# Patient Record
Sex: Female | Born: 1960 | Race: Black or African American | Hispanic: No | Smoking: Former smoker
Health system: Southern US, Community
[De-identification: ages and names within clinical notes are randomized; demographics above are authoritative.]

## PROBLEM LIST (undated history)

## (undated) DIAGNOSIS — K219 Gastro-esophageal reflux disease without esophagitis: Secondary | ICD-10-CM

## (undated) DIAGNOSIS — M7580 Other shoulder lesions, unspecified shoulder: Secondary | ICD-10-CM

## (undated) DIAGNOSIS — J45909 Unspecified asthma, uncomplicated: Secondary | ICD-10-CM

## (undated) DIAGNOSIS — J449 Chronic obstructive pulmonary disease, unspecified: Secondary | ICD-10-CM

## (undated) DIAGNOSIS — F419 Anxiety disorder, unspecified: Secondary | ICD-10-CM

## (undated) DIAGNOSIS — M778 Other enthesopathies, not elsewhere classified: Secondary | ICD-10-CM

## (undated) DIAGNOSIS — Z8673 Personal history of transient ischemic attack (TIA), and cerebral infarction without residual deficits: Secondary | ICD-10-CM

## (undated) HISTORY — DX: Anxiety disorder, unspecified: F41.9

## (undated) HISTORY — DX: Personal history of transient ischemic attack (TIA), and cerebral infarction without residual deficits: Z86.73

## (undated) HISTORY — PX: CHOLECYSTECTOMY: SHX55

## (undated) HISTORY — DX: Other shoulder lesions, unspecified shoulder: M75.80

## (undated) HISTORY — DX: Chronic obstructive pulmonary disease, unspecified: J44.9

## (undated) HISTORY — DX: Other enthesopathies, not elsewhere classified: M77.8

## (undated) HISTORY — DX: Gastro-esophageal reflux disease without esophagitis: K21.9

## (undated) HISTORY — PX: OTHER SURGICAL HISTORY: SHX169

## (undated) HISTORY — PX: TONSILLECTOMY: SUR1361

## (undated) HISTORY — DX: Unspecified asthma, uncomplicated: J45.909

---

## 2012-11-15 ENCOUNTER — Encounter: Payer: Self-pay | Admitting: Critical Care Medicine

## 2012-11-18 ENCOUNTER — Encounter: Payer: Self-pay | Admitting: Critical Care Medicine

## 2012-11-18 ENCOUNTER — Ambulatory Visit (INDEPENDENT_AMBULATORY_CARE_PROVIDER_SITE_OTHER): Payer: No Typology Code available for payment source | Admitting: Critical Care Medicine

## 2012-11-18 VITALS — BP 122/84 | HR 90 | Temp 98.1°F | Ht 64.0 in | Wt 250.0 lb

## 2012-11-18 DIAGNOSIS — Z8673 Personal history of transient ischemic attack (TIA), and cerebral infarction without residual deficits: Secondary | ICD-10-CM | POA: Insufficient documentation

## 2012-11-18 DIAGNOSIS — J449 Chronic obstructive pulmonary disease, unspecified: Secondary | ICD-10-CM

## 2012-11-18 DIAGNOSIS — F419 Anxiety disorder, unspecified: Secondary | ICD-10-CM | POA: Insufficient documentation

## 2012-11-18 DIAGNOSIS — J45909 Unspecified asthma, uncomplicated: Secondary | ICD-10-CM | POA: Insufficient documentation

## 2012-11-18 DIAGNOSIS — K219 Gastro-esophageal reflux disease without esophagitis: Secondary | ICD-10-CM

## 2012-11-18 MED ORDER — BUDESONIDE-FORMOTEROL FUMARATE 160-4.5 MCG/ACT IN AERO: 2.0000 | INHALATION_SPRAY | Freq: Two times a day (BID) | RESPIRATORY_TRACT | Status: DC

## 2012-11-18 MED ORDER — CHLORPHENIRAMINE TANNATE 12 MG PO TABS: 12.0000 mg | ORAL_TABLET | Freq: Every day | ORAL | Status: DC

## 2012-11-18 MED ORDER — BECLOMETHASONE DIPROPIONATE 80 MCG/ACT IN AERS: 3.0000 | INHALATION_SPRAY | Freq: Two times a day (BID) | RESPIRATORY_TRACT | Status: DC

## 2012-11-18 MED ORDER — OMEPRAZOLE 20 MG PO CPDR: 20.0000 mg | DELAYED_RELEASE_CAPSULE | Freq: Two times a day (BID) | ORAL | Status: DC

## 2012-11-18 MED ORDER — FAMOTIDINE 40 MG PO TABS: 40.0000 mg | ORAL_TABLET | Freq: Every evening | ORAL | Status: DC

## 2012-11-18 MED ORDER — BECLOMETHASONE DIPROPIONATE 80 MCG/ACT IN AERS: 2.0000 | INHALATION_SPRAY | Freq: Two times a day (BID) | RESPIRATORY_TRACT | Status: DC

## 2012-11-18 MED ORDER — AZITHROMYCIN 250 MG PO TABS: 250.0000 mg | ORAL_TABLET | Freq: Every day | ORAL | Status: DC

## 2012-11-18 NOTE — Progress Notes (Signed)
Subjective:    Patient ID: Sheryl Hicks, female    DOB: 1961/08/19, 52 y.o.   MRN: [id number]  HPI Comments: Hx of asthma and copd.  In hospital for 9 days over the Holidays   Shortness of Breath This is a chronic problem. The current episode started more than 1 year ago. The problem occurs constantly (rest and exertion). The problem has been rapidly worsening. Associated symptoms include chest pain, a fever, PND, a sore throat, sputum production, swollen glands and wheezing. Pertinent negatives include no abdominal pain, claudication, coryza, ear pain, headaches, hemoptysis, leg pain, leg swelling, neck pain, orthopnea, rash, rhinorrhea or vomiting. The symptoms are aggravated by any activity, exercise, eating, fumes, odors, smoke, emotional upset, weather changes and URIs (down to 2 cigs per day). Associated symptoms comments:  Mucus is white Notes GERD constant, regurgitates and chokes in sleep Stays hoarse. Risk factors include smoking. She has tried beta agonist inhalers and steroid inhalers for the symptoms. The treatment provided mild relief. Her past medical history is significant for asthma, COPD and DVT. There is no history of allergies, aspirin allergies, bronchiolitis, CAD, chronic lung disease, a heart failure, PE, pneumonia or a recent surgery. (Now off coumadin)    CAT Score 11/18/2012  Total CAT Score 39    Past Medical History  Diagnosis Date  . Anxiety   . COPD (chronic obstructive pulmonary disease)   . Tendonitis of shoulder   . GERD (gastroesophageal reflux disease)   . Asthma   . History of stroke      Family History  Problem Relation Age of Onset  . Lung cancer Sister   . Esophageal cancer Mother      History   Social History  . Marital Status: Divorced    Spouse Name: N/A    Number of Children: N/A  . Years of Education: N/A   Occupational History  . Not on file.   Social History Main Topics  . Smoking status: Current Every Day Smoker -- 0.20  packs/day    Types: Cigarettes  . Smokeless tobacco: Never Used     Comment: Started smoking at age 56.   Marland Kitchen Alcohol Use: Yes     Comment: ocass  . Drug Use: No     Comment: former use of crack/cocaine and marijuana  . Sexually Active: Not on file   Other Topics Concern  . Not on file   Social History Narrative  . No narrative on file     Allergies  Allergen Reactions  . Prednisone     hives     Outpatient Prescriptions Prior to Visit  Medication Sig Dispense Refill  . albuterol (PROVENTIL HFA;VENTOLIN HFA) 108 (90 BASE) MCG/ACT inhaler Inhale 2 puffs into the lungs every 6 (six) hours as needed.      . ALPRAZolam (XANAX) 1 MG tablet Take 1 mg by mouth 4 (four) times daily.       . ferrous sulfate 325 (65 FE) MG tablet Take 325 mg by mouth 3 (three) times daily.       Marland Kitchen oxyCODONE-acetaminophen (PERCOCET/ROXICET) 5-325 MG per tablet Take 1 tablet by mouth every 6 (six) hours.       Marland Kitchen omeprazole (PRILOSEC) 20 MG capsule Take 20 mg by mouth daily.       No facility-administered medications prior to visit.    Review of Systems  Constitutional: Positive for fever, chills, diaphoresis, activity change, fatigue and unexpected weight change. Negative for appetite change.  HENT: Positive for hearing  loss, congestion, sore throat, dental problem, voice change, sinus pressure and ear discharge. Negative for ear pain, nosebleeds, facial swelling, rhinorrhea, sneezing, mouth sores, trouble swallowing, neck pain, neck stiffness, postnasal drip and tinnitus.   Eyes: Positive for discharge and visual disturbance. Negative for photophobia and itching.  Respiratory: Positive for cough, sputum production, choking, chest tightness, shortness of breath and wheezing. Negative for apnea, hemoptysis and stridor.   Cardiovascular: Positive for chest pain, palpitations and PND. Negative for orthopnea, claudication and leg swelling.  Gastrointestinal: Positive for nausea, constipation and abdominal  distention. Negative for vomiting, abdominal pain and blood in stool.  Genitourinary: Negative for dysuria, urgency, frequency, hematuria, flank pain, decreased urine volume and difficulty urinating.  Musculoskeletal: Positive for back pain and gait problem. Negative for myalgias, joint swelling and arthralgias.  Skin: Positive for pallor. Negative for color change and rash.  Neurological: Positive for weakness and numbness. Negative for dizziness, tremors, seizures, syncope, speech difficulty, light-headedness and headaches.  Hematological: Negative for adenopathy. Does not bruise/bleed easily.  Psychiatric/Behavioral: Positive for sleep disturbance. Negative for confusion and agitation. The patient is nervous/anxious.        Objective:   Physical Exam Filed Vitals:   11/18/12 1037  BP: 122/84  Pulse: 90  Temp: 98.1 F (36.7 C)  TempSrc: Oral  Height: 5\' 4"  (1.626 m)  Weight: 113.399 kg (250 lb)  SpO2: 97%    Gen: Pleasant, well-nourished, in no distress,  normal affect  ENT: No lesions,  mouth clear,  oropharynx clear, no postnasal drip  Neck: No JVD, no TMG, no carotid bruits  Lungs: No use of accessory muscles, no dullness to percussion, exp wheezes, poor airflow, rhonchi  Cardiovascular: RRR, heart sounds normal, no murmur or gallops, no peripheral edema  Abdomen: soft and NT, no HSM,  BS normal  Musculoskeletal: No deformities, no cyanosis or clubbing  Neuro: alert, non focal  Skin: Warm, no lesions or rashes  CXR 10/2012: No active disease     Assessment & Plan:   COPD (chronic obstructive pulmonary disease) Copd recurrent exacerbations, GERD flare Plan Focus on smoking cessation Stop Advair Trial Symbicort two puff twice daily Trial Qvar 80 two puff twice daily Azithromycin 250mg  Take two once then one daily until gone Increase omeprazole to twice daily taken before meal Spirometry Follow  STRICT reflux  Return 2 months    Updated Medication  List Outpatient Encounter Prescriptions as of 11/18/2012  Medication Sig Dispense Refill  . albuterol (PROVENTIL HFA;VENTOLIN HFA) 108 (90 BASE) MCG/ACT inhaler Inhale 2 puffs into the lungs every 6 (six) hours as needed.      Marland Kitchen albuterol (PROVENTIL) (2.5 MG/3ML) 0.083% nebulizer solution Take 2.5 mg by nebulization every 4 (four) hours as needed for wheezing.      Marland Kitchen ALPRAZolam (XANAX) 1 MG tablet Take 1 mg by mouth 4 (four) times daily.       Marland Kitchen azithromycin (ZITHROMAX) 250 MG tablet Take 1 tablet (250 mg total) by mouth daily. Take two once then one daily until gone  6 each  0  . beclomethasone (QVAR) 80 MCG/ACT inhaler Inhale 3 puffs into the lungs 2 (two) times daily.  4.2 g  1  . budesonide-formoterol (SYMBICORT) 160-4.5 MCG/ACT inhaler Inhale 2 puffs into the lungs 2 (two) times daily.  6 g  1  . ferrous sulfate 325 (65 FE) MG tablet Take 325 mg by mouth 3 (three) times daily.       Marland Kitchen omeprazole (PRILOSEC) 20 MG capsule Take 1  capsule (20 mg total) by mouth 2 (two) times daily before a meal.  60 capsule  6  . oxyCODONE-acetaminophen (PERCOCET/ROXICET) 5-325 MG per tablet Take 1 tablet by mouth every 6 (six) hours.       . [DISCONTINUED] beclomethasone (QVAR) 80 MCG/ACT inhaler Inhale 2 puffs into the lungs 2 (two) times daily.  4.2 g  1  . [DISCONTINUED] Chlorpheniramine Tannate 12 MG TABS Take 1 tablet (12 mg total) by mouth at bedtime.  30 each  6  . [DISCONTINUED] famotidine (PEPCID) 40 MG tablet Take 1 tablet (40 mg total) by mouth every evening.  30 tablet  6  . [DISCONTINUED] Fluticasone-Salmeterol (ADVAIR) 250-50 MCG/DOSE AEPB Inhale 1 puff into the lungs every 12 (twelve) hours.      . [DISCONTINUED] omeprazole (PRILOSEC) 20 MG capsule Take 20 mg by mouth daily.       No facility-administered encounter medications on file as of 11/18/2012.

## 2012-11-18 NOTE — Assessment & Plan Note (Addendum)
Copd recurrent exacerbations, GERD flare Plan Focus on smoking cessation Stop Advair Trial Symbicort two puff twice daily Trial Qvar 80 two puff twice daily Azithromycin 250mg  Take two once then one daily until gone Increase omeprazole to twice daily taken before meal Spirometry Follow  STRICT reflux  Return 2 months

## 2012-11-18 NOTE — Patient Instructions (Addendum)
Focus on smoking cessation Stop Advair Trial Symbicort two puff twice daily Trial Qvar 80 two puff twice daily Azithromycin 250mg  Take two once then one daily until gone Increase omeprazole to twice daily taken before meal Follow  STRICT reflux  Return 2 months

## 2012-11-20 ENCOUNTER — Telehealth: Payer: Self-pay | Admitting: Critical Care Medicine

## 2012-11-25 NOTE — Telephone Encounter (Signed)
Spoke with pt She states that she went to pick up rxs after her ov last wk and though we were only sending in 2 rxs She states that for the 2 rxs it was 70 $ I advised that we didn't send just 2, it was 4 rxs Pt verbalized understanding and states will get her abx filled and then get the rest when she can afford Nothing further needed

## 2012-12-04 ENCOUNTER — Encounter: Payer: Self-pay | Admitting: Cardiology

## 2012-12-04 ENCOUNTER — Ambulatory Visit (INDEPENDENT_AMBULATORY_CARE_PROVIDER_SITE_OTHER): Payer: No Typology Code available for payment source | Admitting: Cardiology

## 2012-12-04 VITALS — BP 130/90 | HR 108 | Ht 64.0 in | Wt 244.0 lb

## 2012-12-04 DIAGNOSIS — F17200 Nicotine dependence, unspecified, uncomplicated: Secondary | ICD-10-CM

## 2012-12-04 DIAGNOSIS — R0600 Dyspnea, unspecified: Secondary | ICD-10-CM | POA: Insufficient documentation

## 2012-12-04 DIAGNOSIS — F1910 Other psychoactive substance abuse, uncomplicated: Secondary | ICD-10-CM

## 2012-12-04 DIAGNOSIS — Z720 Tobacco use: Secondary | ICD-10-CM

## 2012-12-04 DIAGNOSIS — R079 Chest pain, unspecified: Secondary | ICD-10-CM

## 2012-12-04 NOTE — Assessment & Plan Note (Signed)
Patient instructed on the importance of reporting. Did cocaine 3 weeks ago.

## 2012-12-04 NOTE — Assessment & Plan Note (Signed)
Symptoms extremely atypical and not consistent with cardiac etiology. Electrocardiogram shows no ST changes. No plans for further ischemia evaluation.

## 2012-12-04 NOTE — Assessment & Plan Note (Signed)
Patient instructed on the importance of discontinuing.

## 2012-12-04 NOTE — Patient Instructions (Addendum)
Your physician recommends that you schedule a follow-up appointment in: AS NEEDED PENDING TEST RESULTS  Your physician has requested that you have an echocardiogram. Echocardiography is a painless test that uses sound waves to create images of your heart. It provides your doctor with information about the size and shape of your heart and how well your heart's chambers and valves are working. This procedure takes approximately one hour. There are no restrictions for this procedure.    

## 2012-12-04 NOTE — Progress Notes (Signed)
HPI: 52 year old female for evaluation of chest pain. No prior cardiac history. Patient admitted in [facility] in December with dyspnea and chest pain. Enzymes were negative. Chest x-ray negative. Patient treated for COPD/bronchitis. Patient has been seen by Dr. Delford Field for treatment of her COPD. Cardiology is asked to evaluate for her chest pain. Patient states she has had intermittent chest pain for approximately 8 months. It is substernal with radiation to her back. It occurs with coughing. No associated nausea or diaphoresis. Some improvement with pain medications. She has chronic dyspnea on exertion and notes elevated heart rate with exertion.   Current Outpatient Prescriptions  Medication Sig Dispense Refill  . albuterol (PROVENTIL HFA;VENTOLIN HFA) 108 (90 BASE) MCG/ACT inhaler Inhale 2 puffs into the lungs every 6 (six) hours as needed.      Marland Kitchen albuterol (PROVENTIL) (2.5 MG/3ML) 0.083% nebulizer solution Take 2.5 mg by nebulization every 4 (four) hours as needed for wheezing.      Marland Kitchen ALPRAZolam (XANAX) 1 MG tablet Take 1 mg by mouth 4 (four) times daily.       Marland Kitchen azithromycin (ZITHROMAX) 250 MG tablet Take 1 tablet (250 mg total) by mouth daily. Take two once then one daily until gone  6 each  0  . beclomethasone (QVAR) 80 MCG/ACT inhaler Inhale 3 puffs into the lungs 2 (two) times daily.  4.2 g  1  . budesonide-formoterol (SYMBICORT) 160-4.5 MCG/ACT inhaler Inhale 2 puffs into the lungs 2 (two) times daily.  6 g  1  . ferrous sulfate 325 (65 FE) MG tablet Take 325 mg by mouth 3 (three) times daily.       Marland Kitchen omeprazole (PRILOSEC) 20 MG capsule Take 1 capsule (20 mg total) by mouth 2 (two) times daily before a meal.  60 capsule  6  . oxyCODONE-acetaminophen (PERCOCET/ROXICET) 5-325 MG per tablet Take 1 tablet by mouth every 6 (six) hours.        No current facility-administered medications for this visit.    Allergies  Allergen Reactions  . Prednisone     hives    Past Medical  History  Diagnosis Date  . Anxiety   . COPD (chronic obstructive pulmonary disease)   . Tendonitis of shoulder   . GERD (gastroesophageal reflux disease)   . Asthma   . History of stroke     Past Surgical History  Procedure Laterality Date  . Cholecystectomy    . Cysts removed from ovary    . Tonsillectomy    . Arm surgery      History   Social History  . Marital Status: Divorced    Spouse Name: N/A    Number of Children: 1  . Years of Education: N/A   Occupational History  .      Unemployed   Social History Main Topics  . Smoking status: Current Every Day Smoker -- 0.20 packs/day    Types: Cigarettes  . Smokeless tobacco: Never Used     Comment: Started smoking at age 56.   Marland Kitchen Alcohol Use: Yes     Comment: occasional  . Drug Use: No     Comment: former use of crack/cocaine and marijuana  . Sexually Active: Not on file   Other Topics Concern  . Not on file   Social History Narrative  . No narrative on file    Family History  Problem Relation Age of Onset  . Lung cancer Sister   . Esophageal cancer Mother     ROS:  no fevers or chills, productive cough, hemoptysis, dysphasia, odynophagia, melena, hematochezia, dysuria, hematuria, rash, seizure activity, orthopnea, PND, pedal edema, claudication. Remaining systems are negative.  Physical Exam:   Blood pressure 130/90, pulse 108, height 5\' 4"  (1.626 m), weight 244 lb (110.678 kg).  General:  Well developed/obese in NAD Skin warm/dry Patient not depressed No peripheral clubbing Back-normal HEENT-normal/normal eyelids Neck supple/normal carotid upstroke bilaterally; no bruits; no JVD; no thyromegaly chest - CTA/ normal expansion CV - RRR/normal S1 and S2; no murmurs, rubs or gallops;  PMI nondisplaced Abdomen -NT/ND, no HSM, no mass, + bowel sounds, no bruit 2+ femoral pulses, no bruits Ext-no edema, chords, 2+ DP Neuro-grossly nonfocal  ECG 09/23/2012-sinus rhythm with no ST changes.  Today's  electrocardiogram shows sinus tachycardia at a rate of 108. Left axis deviation. Right atrial enlargement. No significant ST changes.

## 2012-12-04 NOTE — Assessment & Plan Note (Signed)
Will arrange echocardiogram to assess LV and RV function. Dyspnea most likely from asthma and COPD. Followup pulmonary.

## 2012-12-11 ENCOUNTER — Encounter: Payer: Self-pay | Admitting: Cardiology

## 2012-12-18 ENCOUNTER — Other Ambulatory Visit: Payer: Self-pay

## 2012-12-18 ENCOUNTER — Ambulatory Visit (HOSPITAL_COMMUNITY): Payer: No Typology Code available for payment source | Attending: Cardiovascular Disease | Admitting: Radiology

## 2012-12-18 ENCOUNTER — Other Ambulatory Visit (HOSPITAL_COMMUNITY): Payer: No Typology Code available for payment source

## 2012-12-18 DIAGNOSIS — R072 Precordial pain: Secondary | ICD-10-CM

## 2012-12-18 DIAGNOSIS — J4489 Other specified chronic obstructive pulmonary disease: Secondary | ICD-10-CM | POA: Insufficient documentation

## 2012-12-18 DIAGNOSIS — R079 Chest pain, unspecified: Secondary | ICD-10-CM | POA: Insufficient documentation

## 2012-12-18 DIAGNOSIS — R0989 Other specified symptoms and signs involving the circulatory and respiratory systems: Secondary | ICD-10-CM | POA: Insufficient documentation

## 2012-12-18 DIAGNOSIS — F17200 Nicotine dependence, unspecified, uncomplicated: Secondary | ICD-10-CM | POA: Insufficient documentation

## 2012-12-18 DIAGNOSIS — R0600 Dyspnea, unspecified: Secondary | ICD-10-CM

## 2012-12-18 DIAGNOSIS — J449 Chronic obstructive pulmonary disease, unspecified: Secondary | ICD-10-CM | POA: Insufficient documentation

## 2012-12-18 DIAGNOSIS — R0609 Other forms of dyspnea: Secondary | ICD-10-CM | POA: Insufficient documentation

## 2012-12-18 NOTE — Progress Notes (Signed)
Echocardiogram performed.  

## 2013-02-10 ENCOUNTER — Encounter: Payer: Self-pay | Admitting: Critical Care Medicine

## 2013-02-10 ENCOUNTER — Ambulatory Visit (INDEPENDENT_AMBULATORY_CARE_PROVIDER_SITE_OTHER): Payer: No Typology Code available for payment source | Admitting: Critical Care Medicine

## 2013-02-10 VITALS — BP 122/70 | HR 107 | Temp 97.9°F | Ht 64.0 in | Wt 238.0 lb

## 2013-02-10 DIAGNOSIS — J449 Chronic obstructive pulmonary disease, unspecified: Secondary | ICD-10-CM

## 2013-02-10 MED ORDER — BUDESONIDE-FORMOTEROL FUMARATE 160-4.5 MCG/ACT IN AERO: 2.0000 | INHALATION_SPRAY | Freq: Two times a day (BID) | RESPIRATORY_TRACT | Status: DC

## 2013-02-10 MED ORDER — BECLOMETHASONE DIPROPIONATE 40 MCG/ACT IN AERS: 2.0000 | INHALATION_SPRAY | Freq: Two times a day (BID) | RESPIRATORY_TRACT | Status: DC

## 2013-02-10 NOTE — Patient Instructions (Addendum)
Use Delsym over the counter as needed for cough Use Qvar two puff twice daily until samples gone Stay on symbicort two puff twice daily Try to get off the last cigarette Return 3 months

## 2013-02-10 NOTE — Progress Notes (Signed)
Subjective:    Patient ID: Sheryl Hicks, female    DOB: [birth year], 52 y.o.   MRN: [id number]  HPI 02/10/2013 Down to one per day of smoking.  Ran out of qvar for 7days. Notes excess thick white mucus.  Notes more wheezing. Dyspnea all day, awakens dyspneic.   Notes some chest pain.  Notes GERD.     CAT Score 02/10/2013 11/18/2012  Total CAT Score 40 39    Past Medical History  Diagnosis Date  . Anxiety   . COPD (chronic obstructive pulmonary disease)   . Tendonitis of shoulder   . GERD (gastroesophageal reflux disease)   . Asthma   . History of stroke      Family History  Problem Relation Age of Onset  . Lung cancer Sister   . Esophageal cancer Mother      History   Social History  . Marital Status: Divorced    Spouse Name: N/A    Number of Children: 1  . Years of Education: N/A   Occupational History  .      Unemployed   Social History Main Topics  . Smoking status: Current Every Day Smoker -- 0.10 packs/day    Types: Cigarettes  . Smokeless tobacco: Never Used     Comment: Started smoking at age 46.   Marland Kitchen Alcohol Use: Yes     Comment: occasional  . Drug Use: No     Comment: former use of crack/cocaine and marijuana  . Sexually Active: Not on file   Other Topics Concern  . Not on file   Social History Narrative  . No narrative on file     Allergies  Allergen Reactions  . Prednisone     hives     Outpatient Prescriptions Prior to Visit  Medication Sig Dispense Refill  . albuterol (PROVENTIL HFA;VENTOLIN HFA) 108 (90 BASE) MCG/ACT inhaler Inhale 2 puffs into the lungs every 6 (six) hours as needed.      Marland Kitchen albuterol (PROVENTIL) (2.5 MG/3ML) 0.083% nebulizer solution Take 2.5 mg by nebulization every 4 (four) hours as needed for wheezing.      Marland Kitchen ALPRAZolam (XANAX) 1 MG tablet Take 1 mg by mouth 4 (four) times daily.       . beclomethasone (QVAR) 80 MCG/ACT inhaler Inhale 3 puffs into the lungs 2 (two) times daily.  4.2 g  1  . ferrous sulfate 325 (65  FE) MG tablet Take 325 mg by mouth 3 (three) times daily.       Marland Kitchen omeprazole (PRILOSEC) 20 MG capsule Take 1 capsule (20 mg total) by mouth 2 (two) times daily before a meal.  60 capsule  6  . oxyCODONE-acetaminophen (PERCOCET/ROXICET) 5-325 MG per tablet Take 1 tablet by mouth every 6 (six) hours.       . budesonide-formoterol (SYMBICORT) 160-4.5 MCG/ACT inhaler Inhale 2 puffs into the lungs 2 (two) times daily.  6 g  1  . azithromycin (ZITHROMAX) 250 MG tablet Take 1 tablet (250 mg total) by mouth daily. Take two once then one daily until gone  6 each  0   No facility-administered medications prior to visit.    Review of Systems  Constitutional: Positive for chills, diaphoresis, activity change, fatigue and unexpected weight change. Negative for appetite change.  HENT: Positive for hearing loss, congestion, dental problem, voice change, sinus pressure and ear discharge. Negative for nosebleeds, facial swelling, sneezing, mouth sores, trouble swallowing, neck stiffness, postnasal drip and tinnitus.   Eyes: Positive for discharge  and visual disturbance. Negative for photophobia and itching.  Respiratory: Positive for cough, choking and chest tightness. Negative for apnea and stridor.   Cardiovascular: Positive for palpitations.  Gastrointestinal: Positive for nausea, constipation and abdominal distention. Negative for blood in stool.  Genitourinary: Negative for dysuria, urgency, frequency, hematuria, flank pain, decreased urine volume and difficulty urinating.  Musculoskeletal: Positive for back pain and gait problem. Negative for myalgias, joint swelling and arthralgias.  Skin: Positive for pallor. Negative for color change.  Neurological: Positive for weakness and numbness. Negative for dizziness, tremors, seizures, syncope, speech difficulty and light-headedness.  Hematological: Negative for adenopathy. Does not bruise/bleed easily.  Psychiatric/Behavioral: Positive for sleep disturbance.  Negative for confusion and agitation. The patient is nervous/anxious.        Objective:   Physical Exam  Filed Vitals:   02/10/13 0916  BP: 122/70  Pulse: 107  Temp: 97.9 F (36.6 C)  TempSrc: Oral  Height: 5\' 4"  (1.626 m)  Weight: 238 lb (107.956 kg)  SpO2: 94%    Gen: Pleasant, well-nourished, in no distress,  normal affect  ENT: No lesions,  mouth clear,  oropharynx clear, no postnasal drip  Neck: No JVD, no TMG, no carotid bruits  Lungs: No use of accessory muscles, no dullness to percussion, exp wheezes, poor airflow, rhonchi  Cardiovascular: RRR, heart sounds normal, no murmur or gallops, no peripheral edema  Abdomen: soft and NT, no HSM,  BS normal  Musculoskeletal: No deformities, no cyanosis or clubbing  Neuro: alert, non focal  Skin: Warm, no lesions or rashes  CXR 10/2012: No active disease     Assessment & Plan:   COPD (chronic obstructive pulmonary disease) Asthmatic bronchitis with chronic obstructive lung disease with ongoing airway inflammation according to continued smoking use Plan Use Delsym over the counter as needed for cough Use Qvar two puff twice daily until samples gone Stay on symbicort two puff twice daily Try to get off the last cigarette Return 3 months     Updated Medication List Outpatient Encounter Prescriptions as of 02/10/2013  Medication Sig Dispense Refill  . albuterol (PROVENTIL HFA;VENTOLIN HFA) 108 (90 BASE) MCG/ACT inhaler Inhale 2 puffs into the lungs every 6 (six) hours as needed.      Marland Kitchen albuterol (PROVENTIL) (2.5 MG/3ML) 0.083% nebulizer solution Take 2.5 mg by nebulization every 4 (four) hours as needed for wheezing.      Marland Kitchen ALPRAZolam (XANAX) 1 MG tablet Take 1 mg by mouth 4 (four) times daily.       . beclomethasone (QVAR) 80 MCG/ACT inhaler Inhale 3 puffs into the lungs 2 (two) times daily.  4.2 g  1  . budesonide-formoterol (SYMBICORT) 160-4.5 MCG/ACT inhaler Inhale 2 puffs into the lungs 2 (two) times daily.  6  g  0  . ferrous sulfate 325 (65 FE) MG tablet Take 325 mg by mouth 3 (three) times daily.       Marland Kitchen omeprazole (PRILOSEC) 20 MG capsule Take 1 capsule (20 mg total) by mouth 2 (two) times daily before a meal.  60 capsule  6  . oxyCODONE-acetaminophen (PERCOCET/ROXICET) 5-325 MG per tablet Take 1 tablet by mouth every 6 (six) hours.       . sertraline (ZOLOFT) 100 MG tablet Take 100 mg by mouth daily.      . [DISCONTINUED] budesonide-formoterol (SYMBICORT) 160-4.5 MCG/ACT inhaler Inhale 2 puffs into the lungs 2 (two) times daily.  6 g  1  . beclomethasone (QVAR) 40 MCG/ACT inhaler Inhale 2 puffs into the  lungs 2 (two) times daily.  1 Inhaler  0  . [DISCONTINUED] azithromycin (ZITHROMAX) 250 MG tablet Take 1 tablet (250 mg total) by mouth daily. Take two once then one daily until gone  6 each  0   No facility-administered encounter medications on file as of 02/10/2013.

## 2013-02-10 NOTE — Assessment & Plan Note (Signed)
Asthmatic bronchitis with chronic obstructive lung disease with ongoing airway inflammation according to continued smoking use Plan Use Delsym over the counter as needed for cough Use Qvar two puff twice daily until samples gone Stay on symbicort two puff twice daily Try to get off the last cigarette Return 3 months

## 2013-04-07 ENCOUNTER — Telehealth: Payer: Self-pay | Admitting: Critical Care Medicine
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
## 2013-04-08 NOTE — Telephone Encounter (Signed)
i agree, she should have gone to ED for this issue.  Can we call her this am to see how she is doing??

## 2013-04-08 NOTE — Telephone Encounter (Signed)
Spoke with pt and she states that she did not go to ER yesterday and that she is feeling much better.  Pt was seen by her PCP this am and is doing okay.

## 2013-04-08 NOTE — Telephone Encounter (Signed)
Noted  

## 2013-04-15 ENCOUNTER — Telehealth: Payer: Self-pay | Admitting: Critical Care Medicine

## 2013-04-15 NOTE — Telephone Encounter (Signed)
All the steroid inhalers are expensive Alternative is oral prednisone 10mg  daily   ?send

## 2013-04-15 NOTE — Telephone Encounter (Signed)
Called and spoke with pt and she is aware that we do not have any samples of the qvar.  Pt stated that the qvar is $202 and the pt cannot afford this.  She stated that the qvar does work well for her. She is willing to change the medication if PW feels this will be better for her.  PW please advise.  Pt is aware that we will not be calling her back until tomorrow.  PW please advise. thanks  Allergies  Allergen Reactions  . Prednisone     hives

## 2013-04-16 MED ORDER — BECLOMETHASONE DIPROPIONATE 80 MCG/ACT IN AERS: 2.0000 | INHALATION_SPRAY | Freq: Two times a day (BID) | RESPIRATORY_TRACT | Status: DC

## 2013-04-16 NOTE — Telephone Encounter (Signed)
There are 2 QVAR's listed on pt's med list > the - and strength. Called spoke with patient to verify her inhaler strength > pt stated she takes the 2 puffs twice daily but her medication list states 3 puffs twice daily. Pt's 2.10.14 ov stated to take 2 puffs BID, but the rx was for 3 puffs BID Dr Delford Field, can you please clarify?  Thank you.

## 2013-04-16 NOTE — Telephone Encounter (Signed)
LMTCB. Did we offer her pt assistance for qvar? Also can offer Dr. Florene Route recs as stated below. Carron Curie, CMA

## 2013-04-16 NOTE — Telephone Encounter (Signed)
Pt returned call.  Spoke with patient and advised of PW's recs below.  Pt stated that she is unable to take oral prednisone d/t documented reaction with hives.  She was unaware of patient assistance; forms filled out for patient, rx generated for PW to sign.  Envelope with verified home address included.  Pt aware to call our office back if she does not receive the forms within the week.

## 2013-04-16 NOTE — Telephone Encounter (Signed)
80 qvar two puff twice daily

## 2013-04-16 NOTE — Telephone Encounter (Signed)
Med listed updated/corrected Rx printed for pt assistance Form and rx placed in PW's look at for signature

## 2013-04-17 NOTE — Telephone Encounter (Signed)
Qvar rx signed by Dr. Delford Field. I have placed this and the qvar pt application in the mail to pt's home address.  Pt aware and voiced no further questions or concerns at this time.

## 2013-05-23 ENCOUNTER — Telehealth: Payer: Self-pay | Admitting: Critical Care Medicine

## 2013-05-23 MED ORDER — BECLOMETHASONE DIPROPIONATE 80 MCG/ACT IN AERS: 2.0000 | INHALATION_SPRAY | Freq: Two times a day (BID) | RESPIRATORY_TRACT | Status: DC

## 2013-05-23 NOTE — Telephone Encounter (Signed)
Spoke with patient-- Patient states her patient asst forms for QVar were incorrectly filled out I have reprinted Rx, filled out Dr. Florene Route information and will give to Crystal for Dr. Delford Field to sign upon his return Patient aware this will be mailed to verified home address as requested and nothing further needed at this time

## 2013-05-26 NOTE — Telephone Encounter (Signed)
Qvar rx and pt application signed by PW. I have placed both in the mail to pt's home address per her request. Pt aware and voiced no further questions or concerns at this time.

## 2013-06-02 ENCOUNTER — Ambulatory Visit (INDEPENDENT_AMBULATORY_CARE_PROVIDER_SITE_OTHER): Payer: No Typology Code available for payment source | Admitting: Critical Care Medicine

## 2013-06-02 ENCOUNTER — Encounter: Payer: Self-pay | Admitting: Critical Care Medicine

## 2013-06-02 VITALS — BP 124/82 | HR 81 | Temp 98.5°F | Ht 64.0 in | Wt 204.0 lb

## 2013-06-02 DIAGNOSIS — J441 Chronic obstructive pulmonary disease with (acute) exacerbation: Secondary | ICD-10-CM

## 2013-06-02 DIAGNOSIS — F17200 Nicotine dependence, unspecified, uncomplicated: Secondary | ICD-10-CM

## 2013-06-02 DIAGNOSIS — Z720 Tobacco use: Secondary | ICD-10-CM

## 2013-06-02 DIAGNOSIS — J449 Chronic obstructive pulmonary disease, unspecified: Secondary | ICD-10-CM

## 2013-06-02 DIAGNOSIS — Z9181 History of falling: Secondary | ICD-10-CM

## 2013-06-02 MED ORDER — METHYLPREDNISOLONE ACETATE 80 MG/ML IJ SUSP
120.0000 mg | Freq: Once | INTRAMUSCULAR | Status: AC
  Administered 2013-06-02: 120 mg via INTRAMUSCULAR

## 2013-06-02 MED ORDER — CICLESONIDE 160 MCG/ACT IN AERS: 2.0000 | INHALATION_SPRAY | Freq: Two times a day (BID) | RESPIRATORY_TRACT | Status: DC

## 2013-06-02 NOTE — Assessment & Plan Note (Signed)
3-10 minutes of smoking cessation counseling was given to the patient

## 2013-06-02 NOTE — Patient Instructions (Addendum)
Use samples given for symbicort and Qvar A 120mg  Depomedrol injection was given in the office You need to quit smoking, use nicotine replacement such as nicotine patch Let us know when your medicaid goes through Return 2 months

## 2013-06-02 NOTE — Progress Notes (Signed)
Subjective:    Patient ID: Sheryl Hicks, female    DOB: 1960-11-11, 52 y.o.   MRN: [id number]  HPI  CAT Score 06/02/2013 02/10/2013 11/18/2012  Total CAT Score 40 40 39   06/02/2013 Chief Complaint  Patient presents with  . 3 month follow up    Breathing has worsened since last OV.  Feels SOB all the time, wheezing, chest tightness at times, and coughing a lot.  Cough is prod with white mucus.  Not able to afford inhalers. No insurance.  Coughing up white mucus.  Qvar helps.  Notes some qhs dyspnea.  1.5 cigs per day.     Past Medical History  Diagnosis Date  . Anxiety   . COPD (chronic obstructive pulmonary disease)   . Tendonitis of shoulder   . GERD (gastroesophageal reflux disease)   . Asthma   . History of stroke      Family History  Problem Relation Age of Onset  . Lung cancer Sister   . Esophageal cancer Mother      History   Social History  . Marital Status: Divorced    Spouse Name: N/A    Number of Children: 1  . Years of Education: N/A   Occupational History  .      Unemployed   Social History Main Topics  . Smoking status: Current Every Day Smoker -- 0.10 packs/day    Types: Cigarettes  . Smokeless tobacco: Never Used     Comment: Started smoking at age 72.   Marland Kitchen Alcohol Use: Yes     Comment: occasional  . Drug Use: No     Comment: former use of crack/cocaine and marijuana  . Sexual Activity: Not on file   Other Topics Concern  . Not on file   Social History Narrative  . No narrative on file     Allergies  Allergen Reactions  . Prednisone     hives     Outpatient Prescriptions Prior to Visit  Medication Sig Dispense Refill  . albuterol (PROVENTIL HFA;VENTOLIN HFA) 108 (90 BASE) MCG/ACT inhaler Inhale 2 puffs into the lungs every 6 (six) hours as needed.      Marland Kitchen albuterol (PROVENTIL) (2.5 MG/3ML) 0.083% nebulizer solution Take 2.5 mg by nebulization every 4 (four) hours as needed for wheezing.      Marland Kitchen ALPRAZolam (XANAX) 1 MG tablet Take 1 mg  by mouth 4 (four) times daily.       . budesonide-formoterol (SYMBICORT) 160-4.5 MCG/ACT inhaler Inhale 2 puffs into the lungs 2 (two) times daily.  6 g  0  . ferrous sulfate 325 (65 FE) MG tablet Take 325 mg by mouth 3 (three) times daily.       Marland Kitchen omeprazole (PRILOSEC) 20 MG capsule Take 1 capsule (20 mg total) by mouth 2 (two) times daily before a meal.  60 capsule  6  . oxyCODONE-acetaminophen (PERCOCET/ROXICET) 5-325 MG per tablet Take 1 tablet by mouth every 6 (six) hours.       . sertraline (ZOLOFT) 100 MG tablet Take 100 mg by mouth daily.      . beclomethasone (QVAR) 80 MCG/ACT inhaler Inhale 2 puffs into the lungs 2 (two) times daily.  3 Inhaler  3   No facility-administered medications prior to visit.    Review of Systems  Constitutional: Positive for chills, diaphoresis, activity change, fatigue and unexpected weight change. Negative for appetite change.  HENT: Positive for hearing loss, congestion, dental problem, voice change, sinus pressure and ear discharge.  Negative for nosebleeds, facial swelling, sneezing, mouth sores, trouble swallowing, neck stiffness, postnasal drip and tinnitus.   Eyes: Positive for discharge and visual disturbance. Negative for photophobia and itching.  Respiratory: Positive for cough, choking and chest tightness. Negative for apnea and stridor.   Cardiovascular: Positive for palpitations.  Gastrointestinal: Positive for nausea, constipation and abdominal distention. Negative for blood in stool.  Genitourinary: Negative for dysuria, urgency, frequency, hematuria, flank pain, decreased urine volume and difficulty urinating.  Musculoskeletal: Positive for back pain and gait problem. Negative for myalgias, joint swelling and arthralgias.  Skin: Positive for pallor. Negative for color change.  Neurological: Positive for weakness and numbness. Negative for dizziness, tremors, seizures, syncope, speech difficulty and light-headedness.  Hematological: Negative  for adenopathy. Does not bruise/bleed easily.  Psychiatric/Behavioral: Positive for sleep disturbance. Negative for confusion and agitation. The patient is nervous/anxious.        Objective:   Physical Exam  Filed Vitals:   06/02/13 0935  BP: 124/82  Pulse: 81  Temp: 98.5 F (36.9 C)  TempSrc: Oral  Height: 5\' 4"  (1.626 m)  Weight: 204 lb (92.534 kg)  SpO2: 95%    Gen: Pleasant, well-nourished, in no distress,  normal affect  ENT: No lesions,  mouth clear,  oropharynx clear, no postnasal drip  Neck: No JVD, no TMG, no carotid bruits  Lungs: No use of accessory muscles, no dullness to percussion, exp wheezes, poor airflow  Cardiovascular: RRR, heart sounds normal, no murmur or gallops, no peripheral edema  Abdomen: soft and NT, no HSM,  BS normal  Musculoskeletal: No deformities, no cyanosis or clubbing  Neuro: alert, non focal  Skin: Warm, no lesions or rashes  CXR 10/2012: No active disease     Assessment & Plan:   COPD (chronic obstructive pulmonary disease) 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 albuterol (PROVENTIL HFA;VENTOLIN HFA) 108 (90 BASE) MCG/ACT inhaler Inhale 2 puffs into the lungs every 6 (six) hours as needed.      Marland Kitchen albuterol (PROVENTIL) (2.5 MG/3ML) 0.083% nebulizer solution Take 2.5 mg by nebulization every 4 (four) hours as needed for wheezing.      Marland Kitchen ALPRAZolam (XANAX) 1 MG tablet Take 1 mg by mouth 4 (four) times daily.       . Benzonatate (TESSALON PERLES PO) Take 1 capsule by mouth 3 (three) times daily. Pt unsure of strength       . budesonide-formoterol (SYMBICORT) 160-4.5 MCG/ACT inhaler Inhale 2 puffs into the lungs 2 (two) times daily.  6 g  0  . ferrous sulfate 325 (65 FE) MG tablet Take 325 mg by mouth 3 (three) times daily.       Marland Kitchen omeprazole (PRILOSEC) 20 MG capsule Take 1 capsule (20 mg total) by mouth 2 (two) times daily before a meal.  60 capsule  6  . oxyCODONE-acetaminophen (PERCOCET/ROXICET) 5-325 MG per tablet Take 1 tablet by mouth every 6 (six) hours.       . sertraline (ZOLOFT) 100 MG tablet Take 100 mg by mouth daily.      . beclomethasone (QVAR) 80 MCG/ACT inhaler Inhale 2 puffs into the lungs 2 (two)  times daily.  3 Inhaler  3  . ciclesonide (ALVESCO) 160 MCG/ACT inhaler Inhale 2 puffs into the lungs 2 (two) times daily.  1 Inhaler  6  . [EXPIRED] methylPREDNISolone acetate (DEPO-MEDROL) injection 120 mg        No facility-administered encounter medications on file as of 06/02/2013.

## 2013-06-02 NOTE — Assessment & Plan Note (Signed)
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

## 2013-06-17 ENCOUNTER — Telehealth: Payer: Self-pay | Admitting: Critical Care Medicine

## 2013-06-17 NOTE — Telephone Encounter (Signed)
This is fine with me   

## 2013-06-17 NOTE — Telephone Encounter (Signed)
Will forward message to Crystal to await completion of form.

## 2013-06-17 NOTE — Telephone Encounter (Signed)
Form has been completed and faxed to [phone] and original has been placed in scan folder upfront. Nothing further at this time.

## 2013-06-17 NOTE — Telephone Encounter (Signed)
Spoke with Selena Batten from patients social security/disability office Kim requesting Dr. Florene Route approval for patient to have PFTs done through the disability office Selena Batten is to fax over a form for Dr. Delford Field to approve/deny testing Per Selena Batten patient needs this done and has not since her hospitalization so they can see exactly where her lung functioning is at at this time.   -- Fax has been received and form given to Crystal, placed in his look at Will route msg to Dr. Delford Field, please advise thank you!

## 2013-06-23 ENCOUNTER — Telehealth: Payer: Self-pay | Admitting: Critical Care Medicine

## 2013-06-23 NOTE — Telephone Encounter (Signed)
Lazarus Salines, RN at 06/17/2013 4:58 PM   Status: Signed            Form has been completed and faxed to [phone] and original has been placed in scan folder upfront.  Nothing further at this time     I called and made pt aware. She needed nothing further.

## 2013-08-04 ENCOUNTER — Ambulatory Visit: Payer: No Typology Code available for payment source | Admitting: Critical Care Medicine

## 2013-08-25 ENCOUNTER — Ambulatory Visit: Payer: No Typology Code available for payment source | Admitting: Critical Care Medicine

## 2013-09-01 ENCOUNTER — Ambulatory Visit: Payer: No Typology Code available for payment source | Admitting: Critical Care Medicine

## 2014-03-12 ENCOUNTER — Encounter: Payer: Self-pay | Admitting: Critical Care Medicine

## 2014-03-12 ENCOUNTER — Ambulatory Visit (INDEPENDENT_AMBULATORY_CARE_PROVIDER_SITE_OTHER): Payer: No Typology Code available for payment source | Admitting: Critical Care Medicine

## 2014-03-12 VITALS — BP 134/76 | HR 84 | Temp 98.4°F | Ht 64.0 in | Wt 248.0 lb

## 2014-03-12 DIAGNOSIS — F17200 Nicotine dependence, unspecified, uncomplicated: Secondary | ICD-10-CM

## 2014-03-12 DIAGNOSIS — Z720 Tobacco use: Secondary | ICD-10-CM

## 2014-03-12 DIAGNOSIS — J4489 Other specified chronic obstructive pulmonary disease: Secondary | ICD-10-CM

## 2014-03-12 DIAGNOSIS — J449 Chronic obstructive pulmonary disease, unspecified: Secondary | ICD-10-CM

## 2014-03-12 MED ORDER — METHYLPREDNISOLONE ACETATE 80 MG/ML IJ SUSP
120.0000 mg | Freq: Once | INTRAMUSCULAR | Status: AC
  Administered 2014-03-12: 120 mg via INTRAMUSCULAR

## 2014-03-12 MED ORDER — FLUTICASONE-SALMETEROL 250-50 MCG/DOSE IN AEPB: 1.0000 | INHALATION_SPRAY | Freq: Two times a day (BID) | RESPIRATORY_TRACT | Status: DC

## 2014-03-12 NOTE — Assessment & Plan Note (Signed)
Copd with chronic bronchitis, issue of drug access and ongoing smoking Plan Start Advair 250 one puff twice daily A depomedrol 120mg  injection was given We will try to get you patient assistance for your advair Try to quit smoking  Return 2 months

## 2014-03-12 NOTE — Patient Instructions (Signed)
Start Advair 250 one puff twice daily A depomedrol 120mg  injection was given We will try to get you patient assistance for your advair Try to quit smoking  Return 2 months

## 2014-03-12 NOTE — Progress Notes (Signed)
Subjective:    Patient ID: Sheryl Hicks, female    DOB: [birth year], 53 y.o.   MRN: [id number]  HPI   03/12/2014 Chief Complaint  Patient presents with  . Follow-up    Breathing has worsened over the past 3 months - has increased SOB at rest and with exertion, prod cough with small amount of white mucus, chest pain with cough, and wheezing.  No fever.  Has been out of breathing meds for months d/t cost.  Continue symbicort and Qvar, use samples A 120mg  Depomedrol injection was given in the office Smoking cessation counseling was given to the patient 3-10 min  Pt has no transportation.  Cannot afford meds.  Now dyspnea is much worse. Still smokes occaisionally Lives in [place].  Working on disability. Now some cough and mucus.  Coughs up mucus that white. No color .  Had to go to ED 1.5 mo ago.  Pt has fallen 2-3 x since last visit   Review of Systems  Constitutional: Positive for chills, diaphoresis, activity change, fatigue and unexpected weight change. Negative for appetite change.  HENT: Positive for congestion, dental problem, ear discharge, hearing loss, sinus pressure and voice change. Negative for facial swelling, mouth sores, nosebleeds, postnasal drip, sneezing, tinnitus and trouble swallowing.   Eyes: Positive for discharge and visual disturbance. Negative for photophobia and itching.  Respiratory: Positive for cough, choking and chest tightness. Negative for apnea and stridor.   Cardiovascular: Positive for palpitations.  Gastrointestinal: Positive for nausea, constipation and abdominal distention. Negative for blood in stool.  Genitourinary: Negative for dysuria, urgency, frequency, hematuria, flank pain, decreased urine volume and difficulty urinating.  Musculoskeletal: Positive for back pain and gait problem. Negative for arthralgias, joint swelling, myalgias and neck stiffness.  Skin: Positive for pallor. Negative for color change.  Neurological: Positive for weakness and  numbness. Negative for dizziness, tremors, seizures, syncope, speech difficulty and light-headedness.  Hematological: Negative for adenopathy. Does not bruise/bleed easily.  Psychiatric/Behavioral: Positive for sleep disturbance. Negative for confusion and agitation. The patient is nervous/anxious.        Objective:   Physical Exam  Filed Vitals:   03/12/14 0950  BP: 134/76  Pulse: 84  Temp: 98.4 F (36.9 C)  TempSrc: Oral  Height: 5\' 4"  (1.626 m)  Weight: 248 lb (112.492 kg)  SpO2: 97%    Gen: Pleasant, well-nourished, in no distress,  normal affect  ENT: No lesions,  mouth clear,  oropharynx clear, no postnasal drip  Neck: No JVD, no TMG, no carotid bruits  Lungs: No use of accessory muscles, no dullness to percussion, exp wheezes, poor airflow  Cardiovascular: RRR, heart sounds normal, no murmur or gallops, no peripheral edema  Abdomen: soft and NT, no HSM,  BS normal  Musculoskeletal: No deformities, no cyanosis or clubbing  Neuro: alert, non focal  Skin: Warm, no lesions or rashes      Assessment & Plan:   Tobacco abuse Hx of tobacco use, still ongoing Plan Pt counseled on smoking cessation 3-10 min  COPD (chronic obstructive pulmonary disease) Copd with chronic bronchitis, issue of drug access and ongoing smoking Plan Start Advair 250 one puff twice daily A depomedrol 120mg  injection was given We will try to get you patient assistance for your advair Try to quit smoking  Return 2 months     Updated Medication List Outpatient Encounter Prescriptions as of 03/12/2014  Medication Sig  . albuterol (PROVENTIL) (2.5 MG/3ML) 0.083% nebulizer solution Take 2.5 mg by nebulization every 4 (  four) hours as needed for wheezing.  Marland Kitchen. ALPRAZolam (XANAX) 1 MG tablet Take 1 mg by mouth 4 (four) times daily.   . Benzonatate (TESSALON PERLES PO) Take 1 capsule by mouth 3 (three) times daily. Pt unsure of strength  . Cyclobenzaprine HCl (FLEXERIL PO) Take by mouth  every 4 (four) hours as needed.  . ferrous sulfate 325 (65 FE) MG tablet Take 325 mg by mouth 3 (three) times daily.   Marland Kitchen. omeprazole (PRILOSEC) 20 MG capsule Take 1 capsule (20 mg total) by mouth 2 (two) times daily before a meal.  . oxyCODONE-acetaminophen (PERCOCET/ROXICET) 5-325 MG per tablet Take 1 tablet by mouth every 6 (six) hours.   . sertraline (ZOLOFT) 100 MG tablet Take 100 mg by mouth daily.  Marland Kitchen. albuterol (PROVENTIL HFA;VENTOLIN HFA) 108 (90 BASE) MCG/ACT inhaler Inhale 2 puffs into the lungs every 6 (six) hours as needed.  . Fluticasone-Salmeterol (ADVAIR) 250-50 MCG/DOSE AEPB Inhale 1 puff into the lungs 2 (two) times daily.  . [DISCONTINUED] beclomethasone (QVAR) 80 MCG/ACT inhaler Inhale 2 puffs into the lungs 2 (two) times daily.  . [DISCONTINUED] budesonide-formoterol (SYMBICORT) 160-4.5 MCG/ACT inhaler Inhale 2 puffs into the lungs 2 (two) times daily.  . [DISCONTINUED] ciclesonide (ALVESCO) 160 MCG/ACT inhaler Inhale 2 puffs into the lungs 2 (two) times daily.  . [EXPIRED] methylPREDNISolone acetate (DEPO-MEDROL) injection 120 mg

## 2014-03-12 NOTE — Assessment & Plan Note (Signed)
Hx of tobacco use, still ongoing Plan Pt counseled on smoking cessation 3-10 min

## 2014-03-13 ENCOUNTER — Telehealth: Payer: Self-pay | Admitting: Critical Care Medicine

## 2014-03-13 MED ORDER — FLUTICASONE-SALMETEROL 250-50 MCG/DOSE IN AEPB: 1.0000 | INHALATION_SPRAY | Freq: Two times a day (BID) | RESPIRATORY_TRACT | Status: DC

## 2014-03-13 NOTE — Addendum Note (Signed)
Addended by: Gweneth Dimitri D on: 03/13/2014 12:49 PM   Modules accepted: Orders

## 2014-03-13 NOTE — Telephone Encounter (Signed)
Pt completed pt asistance forms yesterday fro Advair 250 through [employer]. She left this along with IRS letter stating income. I have completed our section and faxed the pt assistance form to [employer] at [phone] along with advair rx. Pt aware and is aware to call us if she doesn't hear from pt assistance program within the next few wks.

## 2014-05-07 ENCOUNTER — Encounter: Payer: Self-pay | Admitting: Critical Care Medicine

## 2014-05-07 ENCOUNTER — Telehealth: Payer: Self-pay | Admitting: Critical Care Medicine

## 2014-05-07 ENCOUNTER — Ambulatory Visit (INDEPENDENT_AMBULATORY_CARE_PROVIDER_SITE_OTHER): Payer: No Typology Code available for payment source | Admitting: Critical Care Medicine

## 2014-05-07 VITALS — BP 134/76 | HR 104 | Temp 98.1°F | Ht 64.0 in | Wt 248.0 lb

## 2014-05-07 DIAGNOSIS — J449 Chronic obstructive pulmonary disease, unspecified: Secondary | ICD-10-CM

## 2014-05-07 MED ORDER — FLUTICASONE-SALMETEROL 250-50 MCG/DOSE IN AEPB
1.0000 | INHALATION_SPRAY | Freq: Two times a day (BID) | RESPIRATORY_TRACT | Status: DC
Start: 2014-05-07 — End: 2014-05-07

## 2014-05-07 MED ORDER — FLUTICASONE-SALMETEROL 250-50 MCG/DOSE IN AEPB: INHALATION_SPRAY | RESPIRATORY_TRACT | Status: DC

## 2014-05-07 MED ORDER — METHYLPREDNISOLONE ACETATE 80 MG/ML IJ SUSP
120.0000 mg | Freq: Once | INTRAMUSCULAR | Status: AC
  Administered 2014-05-07: 120 mg via INTRAMUSCULAR

## 2014-05-07 MED ORDER — FLUTICASONE FUROATE-VILANTEROL 100-25 MCG/INH IN AEPB: 1.0000 | INHALATION_SPRAY | Freq: Every day | RESPIRATORY_TRACT | Status: DC

## 2014-05-07 NOTE — Progress Notes (Signed)
Subjective:    Patient ID: Sheryl Hicks, female    DOB: 06-29-1961, 53 y.o.   MRN: [id number]  HPI  05/07/2014 Chief Complaint  Patient presents with  . 2 month follow up    Breathing is unchanged. Still has SOB at rest and with exertion, prod cough with white mucus, chest and back pain with cough, and wheezing.  No f/c/s.    Pt not able to get any assistance for advair.  PCP trying to help.  Pt has run out of advair samples.  Pt was well until ran out of advair.  Still smoking a few cigarettes. Mucus is white. No ED visits since last OV>   Review of Systems  Constitutional: Positive for chills, diaphoresis, activity change, fatigue and unexpected weight change. Negative for appetite change.  HENT: Positive for dental problem, ear discharge, hearing loss and voice change. Negative for congestion, facial swelling, mouth sores, nosebleeds, postnasal drip, sinus pressure, sneezing, tinnitus and trouble swallowing.   Eyes: Positive for discharge. Negative for photophobia, itching and visual disturbance.  Respiratory: Positive for cough and chest tightness. Negative for apnea, choking and stridor.   Cardiovascular: Positive for palpitations.  Gastrointestinal: Positive for nausea, constipation and abdominal distention. Negative for blood in stool.  Genitourinary: Negative for dysuria, urgency, frequency, hematuria, flank pain, decreased urine volume and difficulty urinating.  Musculoskeletal: Positive for back pain and gait problem. Negative for arthralgias, joint swelling, myalgias and neck stiffness.  Skin: Positive for pallor. Negative for color change.  Neurological: Positive for weakness and numbness. Negative for dizziness, tremors, seizures, syncope, speech difficulty and light-headedness.  Hematological: Negative for adenopathy. Does not bruise/bleed easily.  Psychiatric/Behavioral: Positive for sleep disturbance. Negative for confusion and agitation. The patient is nervous/anxious.         Objective:   Physical Exam  Filed Vitals:   05/07/14 0936  BP: 134/76  Pulse: 104  Temp: 98.1 F (36.7 C)  TempSrc: Oral  Height: 5\' 4"  (1.626 m)  Weight: 248 lb (112.492 kg)  SpO2: 98%    Gen: Pleasant, well-nourished, in no distress,  normal affect  ENT: No lesions,  mouth clear,  oropharynx clear, no postnasal drip  Neck: No JVD, no TMG, no carotid bruits  Lungs: No use of accessory muscles, no dullness to percussion, exp wheezes, poor airflow  Cardiovascular: RRR, heart sounds normal, no murmur or gallops, no peripheral edema  Abdomen: soft and NT, no HSM,  BS normal  Musculoskeletal: No deformities, no cyanosis or clubbing  Neuro: alert, non focal  Skin: Warm, no lesions or rashes      Assessment & Plan:   COPD (chronic obstructive pulmonary disease) 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 ALPRAZolam (XANAX) 1 MG tablet Take 1 mg by mouth 4 (four) times daily.   . Benzonatate (TESSALON PERLES PO) Take 1 capsule by mouth 3 (three) times daily. Pt unsure of strength  . Cyclobenzaprine HCl (FLEXERIL PO) Take by mouth every 4 (four) hours as needed.  . ferrous sulfate 325 (65 FE) MG tablet Take 325 mg by mouth 3 (three) times daily.   Marland Kitchen. oxyCODONE-acetaminophen (PERCOCET/ROXICET) 5-325 MG per tablet  Take 1 tablet by mouth every 6 (six) hours.   . sertraline (ZOLOFT) 100 MG tablet Take 100 mg by mouth daily.  Marland Kitchen albuterol (PROVENTIL HFA;VENTOLIN HFA) 108 (90 BASE) MCG/ACT inhaler Inhale 2 puffs into the lungs every 6 (six) hours as needed.  Marland Kitchen albuterol (PROVENTIL) (2.5 MG/3ML) 0.083% nebulizer solution  Take 2.5 mg by nebulization every 4 (four) hours as needed for wheezing.  . Fluticasone Furoate-Vilanterol (BREO ELLIPTA) 100-25 MCG/INH AEPB Inhale 1 puff into the lungs daily.  . Fluticasone-Salmeterol (ADVAIR) 250-50 MCG/DOSE AEPB Start once you get your free supply of medication, then STOP BREO  . omeprazole (PRILOSEC) 20 MG capsule Take 1 capsule (20 mg total) by mouth 2 (two) times daily before a meal.  . [DISCONTINUED] Fluticasone-Salmeterol (ADVAIR) 250-50 MCG/DOSE AEPB Inhale 1 puff into the lungs 2 (two) times daily.  . [DISCONTINUED] Fluticasone-Salmeterol (ADVAIR) 250-50 MCG/DOSE AEPB Inhale 1 puff into the lungs 2 (two) times daily.  . [EXPIRED] methylPREDNISolone acetate (DEPO-MEDROL) injection 120 mg

## 2014-05-07 NOTE — Assessment & Plan Note (Signed)
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

## 2014-05-07 NOTE — Patient Instructions (Signed)
A depomedrol 120mg  injection was given Use Breo one puff daily samples until you can get your Advair, then resume the advair Return 2 months Focus on stop smoking

## 2014-05-07 NOTE — Telephone Encounter (Signed)
During OV today, Dr. Delford FieldWright wanted us to check on how pt needs to go about [facility]  Called [facility] at [phone], spoke with Aggie Cosierheresa.  Was advised pt would need to bring:last year's taxes, last 30 days of pay check or a letter of support signed and dated from whoever is helping her pay bills if no income, a picture ID, and proof of [place] Residence.  If photo ID has correct address, this can be used for the proof of Residence.  If address is not correct on photo ID, pt will need to bring in mail or utility bill with current address.  Pt can be screened for this MAP (Medication Assistance Program) on a walk in basis on Tues, Wed, or Thurs from 9-11 and 2-4 at [address].  I have lmomtcb for pt to inform her of above.

## 2014-05-07 NOTE — Telephone Encounter (Signed)
Pt called the [employer] office, I relayed the below info to her.  She is aware of how the process works and what is needed to see if she qualifies.  Nothing further needed at this time.

## 2014-07-02 ENCOUNTER — Ambulatory Visit: Payer: No Typology Code available for payment source | Admitting: Critical Care Medicine

## 2014-07-16 ENCOUNTER — Ambulatory Visit (INDEPENDENT_AMBULATORY_CARE_PROVIDER_SITE_OTHER): Payer: No Typology Code available for payment source | Admitting: Critical Care Medicine

## 2014-07-16 ENCOUNTER — Encounter: Payer: Self-pay | Admitting: Critical Care Medicine

## 2014-07-16 VITALS — BP 122/68 | HR 97 | Ht 64.0 in | Wt 254.0 lb

## 2014-07-16 DIAGNOSIS — J449 Chronic obstructive pulmonary disease, unspecified: Secondary | ICD-10-CM

## 2014-07-16 DIAGNOSIS — Z720 Tobacco use: Secondary | ICD-10-CM

## 2014-07-16 MED ORDER — ALBUTEROL SULFATE HFA 108 (90 BASE) MCG/ACT IN AERS: 2.0000 | INHALATION_SPRAY | Freq: Four times a day (QID) | RESPIRATORY_TRACT | Status: DC | PRN

## 2014-07-16 MED ORDER — METHYLPREDNISOLONE ACETATE 80 MG/ML IJ SUSP
120.0000 mg | Freq: Once | INTRAMUSCULAR | Status: AC
  Administered 2014-07-16: 120 mg via INTRAMUSCULAR

## 2014-07-16 MED ORDER — METHYLPREDNISOLONE (PAK) 4 MG PO TABS: 4.0000 mg | ORAL_TABLET | Freq: Every day | ORAL | Status: DC

## 2014-07-16 MED ORDER — AZITHROMYCIN 250 MG PO TABS: ORAL_TABLET | ORAL | Status: DC

## 2014-07-16 MED ORDER — ALBUTEROL SULFATE (2.5 MG/3ML) 0.083% IN NEBU: 2.5000 mg | INHALATION_SOLUTION | RESPIRATORY_TRACT | Status: DC | PRN

## 2014-07-16 NOTE — Assessment & Plan Note (Signed)
Ongoing tobacco use Patient was given brief smoking cessation counseling

## 2014-07-16 NOTE — Assessment & Plan Note (Signed)
Chronic obstructive lung disease with acute bronchitic component and ongoing tobacco use Acute flare Plan Take medrol dose pak, follow instructions on package for taper Take azithromycin 250mg  Take two once then one daily until gone Stay off cigarettes Stay on Breo, use samples Refill on albuterol sent to pharmacy A depomedrol 120mg  Im injection given  Return 1 week for flu shot

## 2014-07-16 NOTE — Progress Notes (Signed)
Subjective:    Patient ID: Sheryl Hicks, female    DOB: April 25, 1961, 53 y.o.   MRN: [id number]  HPI 07/16/2014 Chief Complaint  Patient presents with  . Follow-up    Pt c/o worsening SOB with exertion, prod cough with white mucus X2 weeks.  States she feels like she did before being hospitalized last time.   Now more dyspneic even at rest. Notes more cough, prod white mucus.  No fever.  Pt coughing through the night.  Still smoking 1-2cig per day but none in two weeks. Symptoms for two weeks. Pt denies any significant sore throat, nasal congestion  fever, chills, sweats, unintended weight loss, pleurtic or exertional chest pain, orthopnea PND, or leg swelling Pt note increase in rescue therapy over baseline, and notes  waking up needing it or having any early am or nocturnal exacerbations of coughing/wheezing/or dyspnea.   Pt  denies any obvious fluctuation in symptoms with  weather or environmental change or other alleviating or aggravating factors  Review of Systems  Constitutional: Positive for chills, diaphoresis, activity change, fatigue and unexpected weight change. Negative for appetite change.  HENT: Positive for dental problem, ear discharge, hearing loss, postnasal drip, sinus pressure and voice change. Negative for congestion, facial swelling, mouth sores, nosebleeds, sneezing, sore throat, tinnitus and trouble swallowing.   Eyes: Positive for discharge. Negative for photophobia, itching and visual disturbance.  Respiratory: Positive for cough, chest tightness, shortness of breath and wheezing. Negative for apnea, choking and stridor.   Cardiovascular: Positive for palpitations.  Gastrointestinal: Positive for nausea, constipation and abdominal distention. Negative for blood in stool.       No heartburn  Genitourinary: Negative for dysuria, urgency, frequency, hematuria, flank pain, decreased urine volume and difficulty urinating.  Musculoskeletal: Positive for back pain and gait  problem. Negative for arthralgias, joint swelling, myalgias and neck stiffness.  Skin: Positive for pallor. Negative for color change.  Neurological: Positive for weakness and numbness. Negative for dizziness, tremors, seizures, syncope, speech difficulty and light-headedness.  Hematological: Negative for adenopathy. Does not bruise/bleed easily.  Psychiatric/Behavioral: Positive for sleep disturbance. Negative for confusion and agitation. The patient is nervous/anxious.        Objective:   Physical Exam  Filed Vitals:   07/16/14 0858  BP: 122/68  Pulse: 97  Height: 5\' 4"  (1.626 m)  Weight: 254 lb (115.214 kg)  SpO2: 96%    Gen: Pleasant, well-nourished, in no distress,  normal affect  ENT: No lesions,  mouth clear,  oropharynx clear, no postnasal drip  Neck: No JVD, no TMG, no carotid bruits  Lungs: No use of accessory muscles, no dullness to percussion, exp wheezes, poor airflow, scattered rhonchi  Cardiovascular: RRR, heart sounds normal, no murmur or gallops, no peripheral edema  Abdomen: soft and NT, no HSM,  BS normal  Musculoskeletal: No deformities, no cyanosis or clubbing  Neuro: alert, non focal  Skin: Warm, no lesions or rashes      Assessment & Plan:   COPD (chronic obstructive pulmonary disease) Chronic obstructive lung disease with acute bronchitic component and ongoing tobacco use Acute flare Plan Take medrol dose pak, follow instructions on package for taper Take azithromycin 250mg  Take two once then one daily until gone Stay off cigarettes Stay on Breo, use samples Refill on albuterol sent to pharmacy A depomedrol 120mg  Im injection given  Return 1 week for flu shot   Tobacco abuse Ongoing tobacco use Patient was given brief smoking cessation counseling    Updated Medication List  Outpatient Encounter Prescriptions as of 07/16/2014  Medication Sig  . albuterol (PROVENTIL HFA;VENTOLIN HFA) 108 (90 BASE) MCG/ACT inhaler Inhale 2 puffs into  the lungs every 6 (six) hours as needed.  Marland Kitchen. albuterol (PROVENTIL) (2.5 MG/3ML) 0.083% nebulizer solution Take 3 mLs (2.5 mg total) by nebulization every 4 (four) hours as needed for wheezing.  Marland Kitchen. ALPRAZolam (XANAX) 1 MG tablet Take 1 mg by mouth 4 (four) times daily.   . Benzonatate (TESSALON PERLES PO) Take 1 capsule by mouth 3 (three) times daily. Pt unsure of strength  . Cyclobenzaprine HCl (FLEXERIL PO) Take by mouth every 4 (four) hours as needed.  . ferrous sulfate 325 (65 FE) MG tablet Take 325 mg by mouth 3 (three) times daily.   . Fluticasone Furoate-Vilanterol (BREO ELLIPTA) 100-25 MCG/INH AEPB Inhale 1 puff into the lungs daily.  Marland Kitchen. omeprazole (PRILOSEC) 20 MG capsule Take 1 capsule (20 mg total) by mouth 2 (two) times daily before a meal.  . oxyCODONE-acetaminophen (PERCOCET/ROXICET) 5-325 MG per tablet Take 1 tablet by mouth every 6 (six) hours.   . sertraline (ZOLOFT) 100 MG tablet Take 100 mg by mouth daily.  . [DISCONTINUED] albuterol (PROVENTIL HFA;VENTOLIN HFA) 108 (90 BASE) MCG/ACT inhaler Inhale 2 puffs into the lungs every 6 (six) hours as needed.  . [DISCONTINUED] albuterol (PROVENTIL) (2.5 MG/3ML) 0.083% nebulizer solution Take 2.5 mg by nebulization every 4 (four) hours as needed for wheezing.  . [DISCONTINUED] Fluticasone-Salmeterol (ADVAIR) 250-50 MCG/DOSE AEPB Start once you get your free supply of medication, then STOP BREO  . azithromycin (ZITHROMAX) 250 MG tablet Take two once then one daily until gone  . methylPREDNIsolone (MEDROL DOSPACK) 4 MG tablet Take 1 tablet (4 mg total) by mouth daily. follow package directions  . [EXPIRED] methylPREDNISolone acetate (DEPO-MEDROL) injection 120 mg

## 2014-07-16 NOTE — Patient Instructions (Signed)
Take medrol dose pak, follow instructions on package for taper Take azithromycin 250mg  Take two once then one daily until gone Stay off cigarettes Stay on Breo, use samples Refill on albuterol sent to pharmacy A depomedrol 120mg  Im injection given  Return 1 week for flu shot

## 2014-07-23 ENCOUNTER — Encounter: Payer: Self-pay | Admitting: Critical Care Medicine

## 2014-07-23 ENCOUNTER — Ambulatory Visit (INDEPENDENT_AMBULATORY_CARE_PROVIDER_SITE_OTHER): Payer: No Typology Code available for payment source | Admitting: Critical Care Medicine

## 2014-07-23 VITALS — BP 138/76 | HR 77 | Temp 98.8°F | Ht 64.0 in | Wt 252.0 lb

## 2014-07-23 DIAGNOSIS — Z23 Encounter for immunization: Secondary | ICD-10-CM

## 2014-07-23 DIAGNOSIS — J449 Chronic obstructive pulmonary disease, unspecified: Secondary | ICD-10-CM

## 2014-07-23 MED ORDER — METHYLPREDNISOLONE ACETATE 80 MG/ML IJ SUSP
80.0000 mg | Freq: Once | INTRAMUSCULAR | Status: AC
  Administered 2014-07-23: 80 mg via INTRAMUSCULAR

## 2014-07-23 MED ORDER — AZITHROMYCIN 250 MG PO TABS: ORAL_TABLET | ORAL | Status: DC

## 2014-07-23 NOTE — Patient Instructions (Signed)
Azithromycin 250mg  Take two once then one daily until gone, sent to downstairs pharmacy A depomedrol injection 120mg  IM was given Flu vaccine was given Return 2 months

## 2014-07-23 NOTE — Progress Notes (Signed)
Subjective:    Patient ID: Sheryl GleeSharon Hicks, female    DOB: 1961-06-10, 53 y.o.   MRN: [id number]  HPI 07/23/2014 Chief Complaint  Patient presents with  . 1 wk follow up    SOB and cough are unchanged.  Cough is prod with white mucus.  Was unable to purchase azithromycin and medrol pak d/t cost.  The patient did not fill recent prescriptions for antibiotics and corticosteroids because of cost factors however the patient still is accessing tobacco products The patient's cough and shortness of breath have remained unchanged    Review of Systems  Constitutional: Positive for chills, diaphoresis, activity change, fatigue and unexpected weight change. Negative for appetite change.  HENT: Positive for dental problem, ear discharge, hearing loss, postnasal drip, sinus pressure and voice change. Negative for congestion, facial swelling, mouth sores, nosebleeds, sneezing, sore throat, tinnitus and trouble swallowing.   Eyes: Positive for discharge. Negative for photophobia, itching and visual disturbance.  Respiratory: Positive for cough, chest tightness, shortness of breath and wheezing. Negative for apnea, choking and stridor.   Cardiovascular: Positive for palpitations.  Gastrointestinal: Positive for nausea, constipation and abdominal distention. Negative for blood in stool.       No heartburn  Genitourinary: Negative for dysuria, urgency, frequency, hematuria, flank pain, decreased urine volume and difficulty urinating.  Musculoskeletal: Positive for back pain and gait problem. Negative for arthralgias, joint swelling, myalgias and neck stiffness.  Skin: Positive for pallor. Negative for color change.  Neurological: Positive for weakness and numbness. Negative for dizziness, tremors, seizures, syncope, speech difficulty and light-headedness.  Hematological: Negative for adenopathy. Does not bruise/bleed easily.  Psychiatric/Behavioral: Positive for sleep disturbance. Negative for confusion and  agitation. The patient is nervous/anxious.        Objective:   Physical Exam  Filed Vitals:   07/23/14 0927  BP: 138/76  Pulse: 77  Temp: 98.8 F (37.1 C)  TempSrc: Oral  Height: 5\' 4"  (1.626 m)  Weight: 252 lb (114.306 kg)  SpO2: 96%    Gen: Pleasant, well-nourished, in no distress,  normal affect  ENT: No lesions,  mouth clear,  oropharynx clear, no postnasal drip  Neck: No JVD, no TMG, no carotid bruits  Lungs: No use of accessory muscles, no dullness to percussion, exp wheezes, poor airflow, scattered rhonchi  Cardiovascular: RRR, heart sounds normal, no murmur or gallops, no peripheral edema  Abdomen: soft and NT, no HSM,  BS normal  Musculoskeletal: No deformities, no cyanosis or clubbing  Neuro: alert, non focal  Skin: Warm, no lesions or rashes      Assessment & Plan:   COPD (chronic obstructive pulmonary disease) Chronic obstructive lung disease with ongoing tobacco use and associated acute tracheobronchitis Difficulty accessing medications secondary to lack of funds Plan Azithromycin 250mg  Take two once then one daily until gone The patient was given samples of Breo A depomedrol injection 120mg  IM was given Flu vaccine was given Return 2 months      Updated Medication List Outpatient Encounter Prescriptions as of 07/23/2014  Medication Sig  . albuterol (PROVENTIL HFA;VENTOLIN HFA) 108 (90 BASE) MCG/ACT inhaler Inhale 2 puffs into the lungs every 6 (six) hours as needed.  Marland Kitchen. albuterol (PROVENTIL) (2.5 MG/3ML) 0.083% nebulizer solution Take 3 mLs (2.5 mg total) by nebulization every 4 (four) hours as needed for wheezing.  Marland Kitchen. ALPRAZolam (XANAX) 1 MG tablet Take 1 mg by mouth 4 (four) times daily.   . Benzonatate (TESSALON PERLES PO) Take 1 capsule by mouth 3 (three) times  daily. Pt unsure of strength  . Cyclobenzaprine HCl (FLEXERIL PO) Take by mouth every 4 (four) hours as needed.  . ferrous sulfate 325 (65 FE) MG tablet Take 325 mg by mouth 3  (three) times daily.   . Fluticasone Furoate-Vilanterol (BREO ELLIPTA) 100-25 MCG/INH AEPB Inhale 1 puff into the lungs daily.  Marland Kitchen. omeprazole (PRILOSEC) 20 MG capsule Take 1 capsule (20 mg total) by mouth 2 (two) times daily before a meal.  . oxyCODONE-acetaminophen (PERCOCET/ROXICET) 5-325 MG per tablet Take 1 tablet by mouth every 6 (six) hours.   . sertraline (ZOLOFT) 100 MG tablet Take 100 mg by mouth daily.  Marland Kitchen. azithromycin (ZITHROMAX) 250 MG tablet Take two once then one daily until gone  . methylPREDNIsolone (MEDROL DOSPACK) 4 MG tablet Take 1 tablet (4 mg total) by mouth daily. follow package directions  . [DISCONTINUED] azithromycin (ZITHROMAX) 250 MG tablet Take two once then one daily until gone  . [EXPIRED] methylPREDNISolone acetate (DEPO-MEDROL) injection 80 mg

## 2014-07-24 ENCOUNTER — Encounter: Payer: Self-pay | Admitting: Critical Care Medicine

## 2014-07-24 NOTE — Assessment & Plan Note (Signed)
Chronic obstructive lung disease with ongoing tobacco use and associated acute tracheobronchitis Difficulty accessing medications secondary to lack of funds Plan Azithromycin 250mg  Take two once then one daily until gone The patient was given samples of Breo A depomedrol injection 120mg  IM was given Flu vaccine was given Return 2 months

## 2014-08-06 ENCOUNTER — Ambulatory Visit: Payer: No Typology Code available for payment source | Admitting: Critical Care Medicine

## 2014-11-26 ENCOUNTER — Encounter: Payer: Self-pay | Admitting: Critical Care Medicine

## 2014-11-26 ENCOUNTER — Ambulatory Visit (INDEPENDENT_AMBULATORY_CARE_PROVIDER_SITE_OTHER): Payer: No Typology Code available for payment source | Admitting: Critical Care Medicine

## 2014-11-26 VITALS — BP 138/94 | HR 89 | Temp 98.3°F | Ht 64.0 in | Wt 250.0 lb

## 2014-11-26 DIAGNOSIS — J449 Chronic obstructive pulmonary disease, unspecified: Secondary | ICD-10-CM

## 2014-11-26 MED ORDER — FLUTICASONE FUROATE-VILANTEROL 100-25 MCG/INH IN AEPB: 1.0000 | INHALATION_SPRAY | Freq: Every day | RESPIRATORY_TRACT | Status: DC

## 2014-11-26 MED ORDER — OMEPRAZOLE 20 MG PO CPDR: 20.0000 mg | DELAYED_RELEASE_CAPSULE | Freq: Every day | ORAL | Status: AC

## 2014-11-26 NOTE — Progress Notes (Signed)
Subjective:    Patient ID: Sheryl Hicks, female    DOB: Feb 28, 1961, 54 y.o.   MRN: [id number]  HPI 11/26/2014 Chief Complaint  Patient presents with  . Follow-up    Nonprod cough x 2 wks.  SOB at baseline.  Occas chest tightness.  No wheezing, CP, or f/c/s.  Pt with dry cough.  Pt notes dyspnea is at baseline.  Pt still with access issues. No smoking noted. Pt denies any significant sore throat, nasal congestion or excess secretions, fever, chills, sweats, unintended weight loss, pleurtic or exertional chest pain, orthopnea PND, or leg swelling Pt denies any increase in rescue therapy over baseline, denies waking up needing it or having any early am or nocturnal exacerbations of coughing/wheezing/or dyspnea. Pt also denies any obvious fluctuation in symptoms with  weather or environmental change or other alleviating or aggravating factors     Review of Systems  Constitutional: Positive for activity change, fatigue and unexpected weight change. Negative for chills, diaphoresis and appetite change.  HENT: Positive for dental problem, hearing loss, postnasal drip and voice change. Negative for congestion, ear discharge, facial swelling, mouth sores, nosebleeds, sinus pressure, sneezing, sore throat, tinnitus and trouble swallowing.   Eyes: Negative for photophobia, discharge, itching and visual disturbance.  Respiratory: Positive for chest tightness and shortness of breath. Negative for apnea, cough, choking, wheezing and stridor.   Cardiovascular: Positive for palpitations.  Gastrointestinal: Positive for nausea and constipation. Negative for blood in stool and abdominal distention.       No heartburn  Genitourinary: Negative for dysuria, urgency, frequency, hematuria, flank pain, decreased urine volume and difficulty urinating.  Musculoskeletal: Positive for back pain and gait problem. Negative for myalgias, joint swelling, arthralgias and neck stiffness.  Skin: Positive for pallor.  Negative for color change.  Neurological: Positive for numbness. Negative for dizziness, tremors, seizures, syncope, speech difficulty, weakness and light-headedness.  Hematological: Negative for adenopathy. Does not bruise/bleed easily.  Psychiatric/Behavioral: Positive for sleep disturbance. Negative for confusion and agitation. The patient is nervous/anxious.        Objective:   Physical Exam Filed Vitals:   11/26/14 1014  BP: 138/94  Pulse: 89  Temp: 98.3 F (36.8 C)  TempSrc: Oral  Height:  (1.626 m)  Weight: 250 lb (113.399 kg)  SpO2: 96%    Gen: Pleasant, well-nourished, in no distress,  normal affect  ENT: No lesions,  mouth clear,  oropharynx clear, no postnasal drip  Neck: No JVD, no TMG, no carotid bruits  Lungs: No use of accessory muscles, no dullness to percussion, exp wheezes, poor airflow,   Cardiovascular: RRR, heart sounds normal, no murmur or gallops, no peripheral edema  Abdomen: soft and NT, no HSM,  BS normal  Musculoskeletal: No deformities, no cyanosis or clubbing  Neuro: alert, non focal  Skin: Warm, no lesions or rashes      Assessment & Plan:   COPD (chronic obstructive pulmonary disease) Stable copd off tobacco Plan Samples given Breo     Updated Medication List Outpatient Encounter Prescriptions as of 11/26/2014  Medication Sig  . albuterol (PROVENTIL HFA;VENTOLIN HFA) 108 (90 BASE) MCG/ACT inhaler Inhale 2 puffs into the lungs every 6 (six) hours as needed.  Marland Kitchen albuterol (PROVENTIL) (2.5 MG/3ML) 0.083% nebulizer solution Take 3 mLs (2.5 mg total) by nebulization every 4 (four) hours as needed for wheezing.  Marland Kitchen ALPRAZolam (XANAX) 1 MG tablet Take 1 mg by mouth 4 (four) times daily.   . Benzonatate (TESSALON PERLES PO) Take 1 capsule  by mouth 3 (three) times daily. Pt unsure of strength  . Cyclobenzaprine HCl (FLEXERIL PO) Take by mouth every 4 (four) hours as needed.  . ferrous sulfate 325 (65 FE) MG tablet Take 325 mg by mouth  3 (three) times daily.   Marland Kitchen. oxyCODONE-acetaminophen (PERCOCET/ROXICET) 5-325 MG per tablet Take 1 tablet by mouth every 6 (six) hours.   . sertraline (ZOLOFT) 100 MG tablet Take 100 mg by mouth daily.  . Fluticasone Furoate-Vilanterol (BREO ELLIPTA) 100-25 MCG/INH AEPB Inhale 1 puff into the lungs daily. (Patient not taking: Reported on 11/26/2014)  . Fluticasone Furoate-Vilanterol (BREO ELLIPTA) 100-25 MCG/INH AEPB Inhale 1 puff into the lungs daily.  Marland Kitchen. omeprazole (PRILOSEC) 20 MG capsule Take 1 capsule (20 mg total) by mouth daily.  . [DISCONTINUED] azithromycin (ZITHROMAX) 250 MG tablet Take two once then one daily until gone (Patient not taking: Reported on 11/26/2014)  . [DISCONTINUED] methylPREDNIsolone (MEDROL DOSPACK) 4 MG tablet Take 1 tablet (4 mg total) by mouth daily. follow package directions (Patient not taking: Reported on 11/26/2014)  . [DISCONTINUED] omeprazole (PRILOSEC) 20 MG capsule Take 1 capsule (20 mg total) by mouth 2 (two) times daily before a meal. (Patient not taking: Reported on 11/26/2014)

## 2014-11-26 NOTE — Patient Instructions (Addendum)
Stay on breo daily, use samples, one puff I sent refill to downstairs pharmacy on omeprazole to see if they can help you access med, you only have to take daily No other changes Return 2 months

## 2014-11-27 NOTE — Assessment & Plan Note (Signed)
Stable copd off tobacco Plan Samples given Breo

## 2015-02-03 ENCOUNTER — Telehealth: Payer: Self-pay | Admitting: Critical Care Medicine

## 2015-02-03 NOTE — Telephone Encounter (Signed)
Received forms from [employer], [employer], [place], [place], and [employer] office regarding a social security claim for pt requesting a statement as to pt's dx, tx, prognosis, and copies of medical records along with the pt's physical or mental impairments r/t ability to do past work and/or related other work.  Forms attached to letter to be completed.  Pt will need OV for this.    [id number] for pt - she has a pending appt in May in [place] with Dr. Delford FieldWright.  Is she ok to wait until then or does she need to come in sooner?

## 2015-02-04 NOTE — Telephone Encounter (Signed)
Looks like message was routed to me in error Will send to [employer]

## 2015-02-04 NOTE — Telephone Encounter (Signed)
Pt cb, informed her Crystal was calling to inform her that we received ssi forms and that she needed appt for forms to be completed, informed pt that crystal wanted to know if she was ok with waiting to complete forms at visit sched for 5/26 in [place] or if she wanting to come in sooner, pt verbalized understanding and stated she was ok to wait until  5/26 to come in and have forms completed due to her having to use transportation to bring her to appts. Nothing further needed.

## 2015-02-04 NOTE — Telephone Encounter (Signed)
ATC pt received busy signal. WCB  

## 2015-02-05 NOTE — Telephone Encounter (Signed)
Noted, thank you.  Forms placed at front with the pending appt paperwork and note placed in appt notes as a reminder to complete during pt's pending appt with PW on Mar 04, 2015 in [place].

## 2015-03-04 ENCOUNTER — Encounter: Payer: Self-pay | Admitting: Critical Care Medicine

## 2015-03-04 ENCOUNTER — Ambulatory Visit (INDEPENDENT_AMBULATORY_CARE_PROVIDER_SITE_OTHER): Payer: No Typology Code available for payment source | Admitting: Critical Care Medicine

## 2015-03-04 VITALS — BP 134/92 | HR 88 | Temp 97.9°F | Ht 64.0 in | Wt 239.0 lb

## 2015-03-04 DIAGNOSIS — J449 Chronic obstructive pulmonary disease, unspecified: Secondary | ICD-10-CM

## 2015-03-04 DIAGNOSIS — R0602 Shortness of breath: Secondary | ICD-10-CM

## 2015-03-04 MED ORDER — METHYLPREDNISOLONE ACETATE 80 MG/ML IJ SUSP
120.0000 mg | Freq: Once | INTRAMUSCULAR | Status: AC
  Administered 2015-03-04: 120 mg via INTRAMUSCULAR

## 2015-03-04 NOTE — Progress Notes (Signed)
Subjective:    Patient ID: Sheryl Hicks, female    DOB: 11/26/1960, 54 y.o.   MRN: [id number]  HPI 03/04/2015 Chief Complaint  Patient presents with  . Follow-up    Increased cough, increased SOB, and wheezing x 1 wk.  Cough nonprod.  No chest tightness/CP or f/c/s.  Would like to discuss Social Security Claim Forms.  Pt with dry cough. More dyspneic and wheezing.   No f/c/s.  Needs SS form filled out.      Pt denies any significant sore throat, nasal congestion or excess secretions, fever, chills, sweats, unintended weight loss, pleurtic or exertional chest pain, orthopnea PND, or leg swelling Pt denies any increase in rescue therapy over baseline, denies waking up needing it or having any early am or nocturnal exacerbations of coughing/wheezing/or dyspnea. Pt also denies any obvious fluctuation in symptoms with  weather or environmental change or other alleviating or aggravating factors   Current Medications, Allergies, Complete Past Medical History, Past Surgical History, Family History, and Social History were reviewed in [facility] electronic medical record per todays encounter:  03/04/2015   Review of Systems  Constitutional: Negative.   HENT: Positive for sore throat, trouble swallowing and voice change. Negative for ear pain, postnasal drip, rhinorrhea and sinus pressure.   Eyes: Negative.   Respiratory: Positive for cough, chest tightness, shortness of breath and wheezing. Negative for apnea, choking and stridor.   Cardiovascular: Negative.  Negative for chest pain, palpitations and leg swelling.  Gastrointestinal: Negative for nausea, vomiting, abdominal pain and abdominal distention.       Mild gerd  Genitourinary: Negative.   Musculoskeletal: Negative.  Negative for myalgias and arthralgias.  Skin: Negative.  Negative for rash.  Allergic/Immunologic: Negative.  Negative for environmental allergies and food allergies.  Neurological: Negative.  Negative for dizziness,  syncope, weakness and headaches.  Hematological: Negative.  Negative for adenopathy. Does not bruise/bleed easily.  Psychiatric/Behavioral: Negative.  Negative for sleep disturbance and agitation. The patient is not nervous/anxious.        Objective:   Physical Exam Filed Vitals:   03/04/15 0947  BP: 134/92  Pulse: 88  Temp: 97.9 F (36.6 C)  TempSrc: Oral  Height: 5\' 4"  (1.626 m)  Weight: 239 lb (108.41 kg)  SpO2: 95%    Gen: Pleasant, well-nourished, in no distress,  normal affect  ENT: No lesions,  mouth clear,  oropharynx clear, no postnasal drip  Neck: No JVD, no TMG, no carotid bruits  Lungs: No use of accessory muscles, no dullness to percussion, exp wheezes  Cardiovascular: RRR, heart sounds normal, no murmur or gallops, no peripheral edema  Abdomen: soft and NT, no HSM,  BS normal  Musculoskeletal: No deformities, no cyanosis or clubbing  Neuro: alert, non focal  Skin: Warm, no lesions or rashes  No results found.        Assessment & Plan:  I personally reviewed all [facility] and [facility] as well as any outside material available during this office visit and agree with the  radiology impressions.   COPD (chronic obstructive pulmonary disease) Gold c copd stable Plan  Filled out social sec info Sampled breo Depo 120mg  im     Sheryl Hicks was seen today for follow-up.  Diagnoses and all orders for this visit:  SOB (shortness of breath) Orders: -     Spirometry with Graph -     methylPREDNISolone acetate (DEPO-MEDROL) injection 120 mg; Inject 1.5 mLs (120 mg total) into the  muscle once.  Chronic obstructive pulmonary disease, unspecified COPD, unspecified chronic bronchitis type

## 2015-03-04 NOTE — Patient Instructions (Addendum)
Samples of Breo given for two months: one puff daily A Depomedrol 120mg  injection was given Social security form filled out  Return 2 months with [employer]

## 2015-03-05 NOTE — Assessment & Plan Note (Signed)
Gold c copd stable Plan  Filled out social sec info Sampled breo Depo 120mg  im

## 2015-05-06 ENCOUNTER — Ambulatory Visit: Payer: No Typology Code available for payment source | Admitting: Adult Health

## 2015-07-01 ENCOUNTER — Encounter: Payer: Self-pay | Admitting: Adult Health

## 2015-07-01 ENCOUNTER — Ambulatory Visit (INDEPENDENT_AMBULATORY_CARE_PROVIDER_SITE_OTHER): Payer: No Typology Code available for payment source | Admitting: Adult Health

## 2015-07-01 VITALS — BP 118/78 | HR 103 | Temp 98.1°F | Ht 64.0 in | Wt 249.0 lb

## 2015-07-01 DIAGNOSIS — J449 Chronic obstructive pulmonary disease, unspecified: Secondary | ICD-10-CM

## 2015-07-01 DIAGNOSIS — J441 Chronic obstructive pulmonary disease with (acute) exacerbation: Secondary | ICD-10-CM

## 2015-07-01 MED ORDER — METHYLPREDNISOLONE ACETATE 80 MG/ML IJ SUSP
120.0000 mg | Freq: Once | INTRAMUSCULAR | Status: AC
  Administered 2015-07-01: 120 mg via INTRAMUSCULAR

## 2015-07-01 MED ORDER — AMOXICILLIN 500 MG PO CAPS: 500.0000 mg | ORAL_CAPSULE | Freq: Two times a day (BID) | ORAL | Status: DC

## 2015-07-01 MED ORDER — LEVALBUTEROL HCL 0.63 MG/3ML IN NEBU
0.6300 mg | INHALATION_SOLUTION | Freq: Once | RESPIRATORY_TRACT | Status: AC
  Administered 2015-07-01: 0.63 mg via RESPIRATORY_TRACT

## 2015-07-01 NOTE — Addendum Note (Signed)
Addended by: York Ram on: 07/01/2015 11:52 AM   Modules accepted: Orders

## 2015-07-01 NOTE — Progress Notes (Signed)
   Subjective:    Patient ID: Sheryl Hicks, female    DOB: 1961-06-19, 54 y.o.   MRN: [id number]  HPI 54 yo female former smoker with GOLD C COPD (FEV1 35%, ratio 62) Previous patient of Dr. Delford Field  .   07/01/2015 Acute OV  Pt presents for an acute office visit.  Complains of 1 week of cough, congestion , drainage , wheezing and dyspnea.  She is on BREO daily however is out of inhaler . Depends on samples.  Does not have insurance . Filing for disability .  Is followed by [facility], has discount card.  She is not using any otc meds for tx.  Denies chest pain, orthopnea, edema or fever.  Discussed getting her flu shot in couple weeks after well.  Says she is allergic to prednisone, request steroid shot .  Says this really helps her a lot.  No recent abx or travel .    Review of Systems .Constitutional:   No  weight loss, night sweats,  Fevers, chills,  +fatigue, or  lassitude.  HEENT:   No headaches,  Difficulty swallowing,  Tooth/dental problems, or  Sore throat,                No sneezing, itching, ear ache,  +nasal congestion, post nasal drip,   CV:  No chest pain,  Orthopnea, PND, swelling in lower extremities, anasarca, dizziness, palpitations, syncope.   GI  No heartburn, indigestion, abdominal pain, nausea, vomiting, diarrhea, change in bowel habits, loss of appetite, bloody stools.   Resp:    No chest wall deformity  Skin: no rash or lesions.  GU: no dysuria, change in color of urine, no urgency or frequency.  No flank pain, no hematuria   MS:  No joint pain or swelling.  No decreased range of motion.  No back pain.  Psych:  No change in mood or affect. No depression or anxiety.  No memory loss.         Objective:   Physical Exam GEN: A/Ox3; pleasant , NAD, obese  Vital signs reviewed   HEENT:  [place]/AT,  EACs-clear, TMs-wnl, NOSE-clear, THROAT-clear, no lesions, no postnasal drip or exudate noted.   NECK:  Supple w/ fair ROM; no JVD; normal carotid  impulses w/o bruits; no thyromegaly or nodules palpated; no lymphadenopathy.  RESP  Few trace wheezes , w/o, wheezes/ rales/ or rhonchi.no accessory muscle use, no dullness to percussion  CARD:  RRR, no m/r/g  , no peripheral edema, pulses intact, no cyanosis or clubbing.  GI:   Soft & nt; nml bowel sounds; no organomegaly or masses detected.  Musco: Warm bil, no deformities or joint swelling noted.   Neuro: alert, no focal deficits noted.    Skin: Warm, no lesions or rashes         Assessment & Plan:

## 2015-07-01 NOTE — Assessment & Plan Note (Addendum)
Exacerbation with URI  cxr today   Plan  Depo Medrol  shot.  Amoxicillin  Twice daily  For 7 days .  Mucinex DM Twice daily  As needed  Cough/congestion  Follow up Dr. Vassie Loll  In 2 months and As needed   Please contact office for sooner follow up if symptoms do not improve or worsen or seek emergency care

## 2015-07-01 NOTE — Progress Notes (Signed)
Reviewed & agree with plan  

## 2015-07-01 NOTE — Patient Instructions (Signed)
Depo Medrol  shot.  Amoxicillin  Twice daily  For 7 days .  Mucinex DM Twice daily  As needed  Cough/congestion  Follow up Dr. Vassie Loll  In 2 months and As needed   Please contact office for sooner follow up if symptoms do not improve or worsen or seek emergency care

## 2015-07-12 ENCOUNTER — Ambulatory Visit (HOSPITAL_BASED_OUTPATIENT_CLINIC_OR_DEPARTMENT_OTHER)
Admission: RE | Admit: 2015-07-12 | Discharge: 2015-07-12 | Disposition: A | Discharge status: Home / Self Care | Payer: No Typology Code available for payment source | Source: Ambulatory Visit | Attending: Adult Health | Admitting: Adult Health

## 2015-07-12 DIAGNOSIS — J449 Chronic obstructive pulmonary disease, unspecified: Secondary | ICD-10-CM

## 2015-07-12 DIAGNOSIS — Z8673 Personal history of transient ischemic attack (TIA), and cerebral infarction without residual deficits: Secondary | ICD-10-CM | POA: Insufficient documentation

## 2015-07-12 DIAGNOSIS — Z87891 Personal history of nicotine dependence: Secondary | ICD-10-CM | POA: Insufficient documentation

## 2015-07-12 DIAGNOSIS — R0602 Shortness of breath: Secondary | ICD-10-CM | POA: Insufficient documentation

## 2015-07-12 DIAGNOSIS — R05 Cough: Secondary | ICD-10-CM | POA: Insufficient documentation

## 2015-07-12 DIAGNOSIS — J441 Chronic obstructive pulmonary disease with (acute) exacerbation: Secondary | ICD-10-CM

## 2015-07-12 DIAGNOSIS — R918 Other nonspecific abnormal finding of lung field: Secondary | ICD-10-CM | POA: Insufficient documentation

## 2015-07-13 ENCOUNTER — Ambulatory Visit (HOSPITAL_BASED_OUTPATIENT_CLINIC_OR_DEPARTMENT_OTHER): Payer: Self-pay

## 2015-07-13 ENCOUNTER — Telehealth: Payer: Self-pay | Admitting: Adult Health

## 2015-07-13 ENCOUNTER — Other Ambulatory Visit: Payer: Self-pay | Admitting: Adult Health

## 2015-07-13 DIAGNOSIS — J449 Chronic obstructive pulmonary disease, unspecified: Secondary | ICD-10-CM

## 2015-07-13 NOTE — Telephone Encounter (Signed)
Notes Recorded by Julio Sicks, NP on 07/13/2015 at 7:55 AM Xray shows some probable scarring on right however no comparisons  There is some volume loss on RUL  Will need to get CT chest to better visualize this area Please set up CT chest w/ contrast to look at his , place in reminder list  ---------- Pt is aware of her results again. Nothing further was needed.

## 2015-07-13 NOTE — Progress Notes (Signed)
Quick Note:  Called and spoke with pt. Reviewed results and recs. Pt voiced understanding and had no further questions. Order for CT was placed and CT was put on reminder list. Nothing further needed ______

## 2015-07-15 ENCOUNTER — Telehealth: Payer: Self-pay | Admitting: Critical Care Medicine

## 2015-07-15 ENCOUNTER — Other Ambulatory Visit: Payer: No Typology Code available for payment source

## 2015-07-15 ENCOUNTER — Other Ambulatory Visit (HOSPITAL_BASED_OUTPATIENT_CLINIC_OR_DEPARTMENT_OTHER): Payer: No Typology Code available for payment source

## 2015-07-15 ENCOUNTER — Ambulatory Visit (HOSPITAL_BASED_OUTPATIENT_CLINIC_OR_DEPARTMENT_OTHER)
Admission: RE | Admit: 2015-07-15 | Discharge: 2015-07-15 | Disposition: A | Discharge status: Home / Self Care | Payer: Self-pay | Source: Ambulatory Visit | Attending: Adult Health | Admitting: Adult Health

## 2015-07-15 DIAGNOSIS — R05 Cough: Secondary | ICD-10-CM | POA: Insufficient documentation

## 2015-07-15 DIAGNOSIS — J479 Bronchiectasis, uncomplicated: Secondary | ICD-10-CM | POA: Insufficient documentation

## 2015-07-15 DIAGNOSIS — J449 Chronic obstructive pulmonary disease, unspecified: Secondary | ICD-10-CM | POA: Insufficient documentation

## 2015-07-15 DIAGNOSIS — R918 Other nonspecific abnormal finding of lung field: Secondary | ICD-10-CM | POA: Insufficient documentation

## 2015-07-15 DIAGNOSIS — Z87891 Personal history of nicotine dependence: Secondary | ICD-10-CM | POA: Insufficient documentation

## 2015-07-15 MED ORDER — IOHEXOL 300 MG/ML  SOLN
75.0000 mL | Freq: Once | INTRAMUSCULAR | Status: AC | PRN
  Administered 2015-07-15: 75 mL via INTRAVENOUS

## 2015-07-15 NOTE — Telephone Encounter (Signed)
CT scan shows significant scarring changes in the right lung. This seems to be worse than described on cxr reports from 2013, so there may be an active process. Can't be sure. If patient feels sick, suggest she be seen (TP?)  Sooner than her scheduled appointment with Dr Vassie Loll.

## 2015-07-15 NOTE — Telephone Encounter (Signed)
Received call report from Casandra at [facility] radiology for a CT chest from 10.6.2016. The radiologist requested the report be called in. Will send to doc of day as this is a former [facility] pt.   Dr. Maple Hudson please advise.    IMPRESSION: 1. Severe right upper lobe volume loss with associated calcified granulomas, bronchiectasis, parenchymal banding, distortion and subpleural consolidation in reticulation. Similar but less prominent findings in the right middle lobe with evidence of air trapping in the right middle lobe. 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 2. Nonspecific ground-glass centrilobular nodularity throughout both lungs. Differential includes infectious or inflammatory bronchiolitis, endobronchial spread of tuberculosis or hypersensitivity pneumonitis. Consider a follow-up high-resolution chest CT study in 6-12 months to assess pattern stability. These results will be called to the ordering clinician or representative by the Radiologist Assistant, and communication documented in the PACS or zVision Dashboard

## 2015-07-15 NOTE — Telephone Encounter (Signed)
Spoke with pt. She is aware of her results. States that she would like her appointment moved up. OV has been moved to 07/23/15 at 9:30am with TP. Nothing further was needed.

## 2015-07-19 ENCOUNTER — Telehealth: Payer: Self-pay | Admitting: Internal Medicine

## 2015-07-19 NOTE — Telephone Encounter (Signed)
lmtcb x1 for pt. 

## 2015-07-20 NOTE — Telephone Encounter (Signed)
Pt returned call [phone] °

## 2015-07-20 NOTE — Telephone Encounter (Signed)
atc X2, fast busy signal.  Wcb.  

## 2015-07-20 NOTE — Telephone Encounter (Signed)
Pt returned call [phone]

## 2015-07-20 NOTE — Telephone Encounter (Signed)
lmtcb

## 2015-07-20 NOTE — Telephone Encounter (Signed)
Patient would like more detailed information about her CT results.  Patient is concerned about cancer.  She does not want to wait until OV to get more information. TP, please advise.

## 2015-07-20 NOTE — Telephone Encounter (Signed)
Discussed results with pt  Diffuse scarring on right is noted.  Will try to copy of old CT scan from [facility] .  Pt to follow in office next week to review in detail  shhe  Is starting to feel better.

## 2015-07-21 NOTE — Progress Notes (Signed)
Quick Note:  Fax sent to [facility] requesting ct chest results ______

## 2015-07-21 NOTE — Progress Notes (Signed)
Quick Note:  Called and spoke with pt. Reviewed results and recs. Informed her that order for CT chest will be placed for 6 months and that we requested CT chest records from [facility] are awaiting fax. Pt voiced understanding and had no further questions. Will hold in my result box until CT results from [facility] are obtained. ______

## 2015-07-23 ENCOUNTER — Ambulatory Visit: Payer: No Typology Code available for payment source | Admitting: Adult Health

## 2015-07-27 ENCOUNTER — Ambulatory Visit: Payer: No Typology Code available for payment source | Admitting: Adult Health

## 2015-07-29 ENCOUNTER — Ambulatory Visit: Payer: No Typology Code available for payment source | Admitting: Adult Health

## 2015-07-30 ENCOUNTER — Telehealth: Payer: Self-pay

## 2015-07-30 NOTE — Telephone Encounter (Signed)
Opened in error

## 2015-09-09 ENCOUNTER — Encounter: Payer: Self-pay | Admitting: Adult Health

## 2015-09-16 ENCOUNTER — Ambulatory Visit: Payer: No Typology Code available for payment source | Admitting: Adult Health

## 2015-09-16 ENCOUNTER — Ambulatory Visit: Payer: No Typology Code available for payment source | Admitting: Pulmonary Disease

## 2015-10-21 ENCOUNTER — Encounter: Payer: Self-pay | Admitting: Adult Health

## 2015-10-21 ENCOUNTER — Ambulatory Visit (INDEPENDENT_AMBULATORY_CARE_PROVIDER_SITE_OTHER): Payer: No Typology Code available for payment source | Admitting: Adult Health

## 2015-10-21 VITALS — BP 125/69 | HR 93 | Temp 98.0°F | Ht 64.0 in | Wt 253.0 lb

## 2015-10-21 DIAGNOSIS — R9389 Abnormal findings on diagnostic imaging of other specified body structures: Secondary | ICD-10-CM

## 2015-10-21 DIAGNOSIS — J440 Chronic obstructive pulmonary disease with acute lower respiratory infection: Secondary | ICD-10-CM

## 2015-10-21 DIAGNOSIS — R938 Abnormal findings on diagnostic imaging of other specified body structures: Secondary | ICD-10-CM

## 2015-10-21 MED ORDER — AZITHROMYCIN 250 MG PO TABS: ORAL_TABLET | ORAL | Status: DC

## 2015-10-21 MED ORDER — LEVALBUTEROL HCL 0.63 MG/3ML IN NEBU
0.6300 mg | INHALATION_SOLUTION | Freq: Once | RESPIRATORY_TRACT | Status: AC
  Administered 2015-10-21: 0.63 mg via RESPIRATORY_TRACT

## 2015-10-21 MED ORDER — BUDESONIDE-FORMOTEROL FUMARATE 160-4.5 MCG/ACT IN AERO: 2.0000 | INHALATION_SPRAY | Freq: Two times a day (BID) | RESPIRATORY_TRACT | Status: DC

## 2015-10-21 MED ORDER — FLUTICASONE FUROATE-VILANTEROL 100-25 MCG/INH IN AEPB: 1.0000 | INHALATION_SPRAY | Freq: Every day | RESPIRATORY_TRACT | Status: AC

## 2015-10-21 NOTE — Addendum Note (Signed)
Addended by: Karalee HeightOX, Nickson Middlesworth P on: 10/21/2015 12:08 PM   Modules accepted: Orders

## 2015-10-21 NOTE — Patient Instructions (Signed)
Zpack take as directed.  May change to Symbicort 160/4.515mcg 2 puffs Twice daily  .  Mucinex DM Twice daily  As needed  Cough/congestion. CT chest set up prior to next ov.   Follow up Dr. Vassie LollAlva  In 2-3  months and As needed   Please contact office for sooner follow up if symptoms do not improve or worsen or seek emergency care

## 2015-10-21 NOTE — Assessment & Plan Note (Addendum)
Frequent exacerbation with URI  Hold on steroids for now as she is allergic to prednisone. Had a depo shot last ov.   Plan  Zpack take as directed.  May change to Symbicort 160/4.695mcg 2 puffs Twice daily  .  Mucinex DM Twice daily  As needed  Cough/congestion. CT chest set up prior to next ov.   Follow up Dr. Vassie LollAlva  In 2-3  months and As needed   Please contact office for sooner follow up if symptoms do not improve or worsen or seek emergency care

## 2015-10-21 NOTE — Progress Notes (Signed)
Subjective:    Patient ID: Sheryl Hicks, female    DOB: May 23, 1961, 55 y.o.   MRN: [id number]  HPI  55 yo female former smoker 47  with GOLD C COPD (FEV1 35%, ratio 62) Previous patient of Dr Delford Field  .  Hx polysubstance abuse with crack cocaine.    TEST  CT chest 07/2015 >Severe right upper lobe volume loss with associated calcified granulomas, bronchiectasis, parenchymal banding, distortion and subpleural consolidation in reticulation. Similar but less prominent findings in the right middle lobe with evidence of air trapping in the right middle lobe. 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 2. Nonspecific ground-glass centrilobular nodularity throughout both lungs.  10/21/2015 Follow up : COPD GOLD C  Pt returns for  3 month follow up.  Pt complains  chest congestion/tightness, sinus pressure, prod cough with yellow to white colored mucus, low grade fever with sweats, and SOB with activity x 2 weeks. She is on BREO daily however is out of inhaler . Depends on samples. Currently out of BREO for few days or more. Requests samples today . 2 samples given . CT chest last ov with severe RUL volume loss w/ associated granulomas /bronchiectasis and nonspecific GG nodularity bilaterally.  Report from [facility] radiology with CXR 06/2015 with chronic RUL scarring and volume loss with comparison film from 2014. 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 She completed course. Says she has had chronic scarring since then. Care Everywhere reviewed with no previous CT chest with  only cxrs. Noted on list.  Does not have insurance . Filing for disability . Has hearing next month.  Is followed by [facility], has discount card. Says she wants to change to Symbicort and can get through program.  Denies chest pain, orthopnea, edema or fever.  Says she is allergic to prednisone.  Wants order for window airconditioner unit, says she needs one and was told if I  wrote for it she could get it. Order given .     Past Medical History  Diagnosis Date  . Anxiety   . COPD (chronic obstructive pulmonary disease) (HCC)   . Tendonitis of shoulder   . GERD (gastroesophageal reflux disease)   . Asthma   . History of stroke    Current Outpatient Prescriptions on File Prior to Visit  Medication Sig Dispense Refill  . albuterol (PROVENTIL HFA;VENTOLIN HFA) 108 (90 BASE) MCG/ACT inhaler Inhale 2 puffs into the lungs every 6 (six) hours as needed. 18 g 4  . albuterol (PROVENTIL) (2.5 MG/3ML) 0.083% nebulizer solution Take 3 mLs (2.5 mg total) by nebulization every 4 (four) hours as needed for wheezing. 75 mL 6  . ALPRAZolam (XANAX) 1 MG tablet Take 1 mg by mouth 4 (four) times daily.     . Benzonatate (TESSALON PERLES PO) Take 1 capsule by mouth 3 (three) times daily. Pt unsure of strength    . Cyclobenzaprine HCl (FLEXERIL PO) Take by mouth every 4 (four) hours as needed.    . ferrous sulfate 325 (65 FE) MG tablet Take 325 mg by mouth 3 (three) times daily.     Marland Kitchen omeprazole (PRILOSEC) 20 MG capsule Take 1 capsule (20 mg total) by mouth daily. 30 capsule 6  . oxyCODONE-acetaminophen (PERCOCET/ROXICET) 5-325 MG per tablet Take 1 tablet by mouth every 6 (six) hours.     . sertraline (ZOLOFT) 100 MG tablet Take 100 mg by mouth daily.     No current facility-administered medications on  file prior to visit.    Review of Systems  .Constitutional:   No  weight loss, night sweats,  Fevers, chills,  +fatigue, or  lassitude.  HEENT:   No headaches,  Difficulty swallowing,  Tooth/dental problems, or  Sore throat,                No sneezing, itching, ear ache,  +nasal congestion, post nasal drip,   CV:  No chest pain,  Orthopnea, PND, swelling in lower extremities, anasarca, dizziness, palpitations, syncope.   GI  No heartburn, indigestion, abdominal pain, nausea, vomiting, diarrhea, change in bowel habits, loss of appetite, bloody stools.   Resp:    No chest  wall deformity  Skin: no rash or lesions.  GU: no dysuria, change in color of urine, no urgency or frequency.  No flank pain, no hematuria   MS:  No joint pain or swelling.  No decreased range of motion.  No back pain.  Psych:  No change in mood or affect. No depression or anxiety.  No memory loss.         Objective:   Physical Exam  Filed Vitals:   10/21/15 1008  BP: 125/69  Pulse: 93  Temp: 98 F (36.7 C)  TempSrc: Oral  Height: 5\' 4"  (1.626 m)  Weight: 253 lb (114.76 kg)  SpO2: 95%   Body mass index is 43.41 kg/(m^2).   GEN: A/Ox3; pleasant , NAD, obese   HEENT:  [place]/AT,  EACs-clear, TMs-wnl, NOSE-clear drainage , THROAT-clear, no lesions, no postnasal drip or exudate noted.   NECK:  Supple w/ fair ROM; no JVD; normal carotid impulses w/o bruits; no thyromegaly or nodules palpated; no lymphadenopathy.  RESP  Decreased BS in bases  , w/o, wheezes/ rales/ or rhonchi.no accessory muscle use, no dullness to percussion  CARD:  RRR, no m/r/g  , no peripheral edema, pulses intact, no cyanosis or clubbing.  GI:   Soft & nt; nml bowel sounds; no organomegaly or masses detected.  Musco: Warm bil, no deformities or joint swelling noted.   Neuro: alert, no focal deficits noted.    Skin: Warm, no lesions or rashes         Assessment & Plan:

## 2015-10-21 NOTE — Assessment & Plan Note (Signed)
CT chest 07/2015 with severe RUL volume loss w/ associated granulomas /bronchiectasis and nonspecific GG nodularity bilateral Suspect this is chronic however no previous CT chest in heavy smoker  Will repeat at 6 month ~01/2016 -if stable push out to  1 year.

## 2015-10-25 NOTE — Progress Notes (Signed)
Reviewed & agree with plan  

## 2015-12-16 ENCOUNTER — Encounter: Payer: Self-pay | Admitting: Pulmonary Disease

## 2015-12-16 ENCOUNTER — Ambulatory Visit (INDEPENDENT_AMBULATORY_CARE_PROVIDER_SITE_OTHER): Payer: No Typology Code available for payment source | Admitting: Pulmonary Disease

## 2015-12-16 ENCOUNTER — Ambulatory Visit: Payer: No Typology Code available for payment source | Admitting: Pulmonary Disease

## 2015-12-16 VITALS — BP 118/84 | HR 100 | Ht 64.0 in | Wt 248.0 lb

## 2015-12-16 DIAGNOSIS — G4733 Obstructive sleep apnea (adult) (pediatric): Secondary | ICD-10-CM

## 2015-12-16 DIAGNOSIS — R938 Abnormal findings on diagnostic imaging of other specified body structures: Secondary | ICD-10-CM

## 2015-12-16 DIAGNOSIS — J449 Chronic obstructive pulmonary disease, unspecified: Secondary | ICD-10-CM

## 2015-12-16 DIAGNOSIS — R9389 Abnormal findings on diagnostic imaging of other specified body structures: Secondary | ICD-10-CM

## 2015-12-16 MED ORDER — IPRATROPIUM-ALBUTEROL 0.5-2.5 (3) MG/3ML IN SOLN: 3.0000 mL | Freq: Two times a day (BID) | RESPIRATORY_TRACT | Status: DC

## 2015-12-16 MED ORDER — PREDNISONE 10 MG PO TABS: ORAL_TABLET | ORAL | Status: DC

## 2015-12-16 NOTE — Assessment & Plan Note (Signed)
FU CT in 01/2016 - but favor post infectios sacrring

## 2015-12-16 NOTE — Progress Notes (Signed)
   Subjective:    Patient ID: Sheryl GleeSharon Hicks, female    DOB: [birth year], 55 y.o.   MRN: [id number]  HPI  55 yo female former smoker [id number]  with GOLD C COPD (FEV1 35%, ratio 62) & RUL collapse/ scarring Previous patient of Dr Delford FieldWright  .  Hx polysubstance abuse with crack cocaine.  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 She completed course.  12/16/2015  Chief Complaint  Patient presents with  . Follow-up    SOB; some cough; pt says that she feels like she needs oxygen even though all the tests done (walking tests, etc.) have all been normal. Wants flu shot and Prevnar vaccine.   Breathing bad -Reports dry cough and occasional wheezing Wonders if O2 will help   Does not have insurance . In the past, has received breo samples but states symbicort works better than powders She also reports loud snoring and daytime somnolence and fatigue has never had sleep study  Significant tests/ events  CT chest 07/2015 >Severe right upper lobe volume loss with associated calcified granulomas, bronchiectasis, parenchymal banding, distortion and subpleural consolidation in reticulation. Similar but less prominent findings in the right middle lobe with evidence of air trapping in the right middle lobe. 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  Nonspecific ground-glass centrilobular nodularity throughout both lungs.   Past Medical History  Diagnosis Date  . Anxiety   . COPD (chronic obstructive pulmonary disease) (HCC)   . Tendonitis of shoulder   . GERD (gastroesophageal reflux disease)   . Asthma   . History of stroke     Review of Systems neg for any significant sore throat, dysphagia, itching, sneezing, nasal congestion or excess/ purulent secretions, fever, chills, sweats, unintended wt loss, pleuritic or exertional cp, hempoptysis, orthopnea pnd or change in chronic leg swelling.  Also denies presyncope, palpitations, heartburn, abdominal pain, nausea, vomiting, diarrhea or change in  bowel or urinary habits, dysuria,hematuria, rash, arthralgias, visual complaints, headache, numbness weakness or ataxia.     Objective:   Physical Exam  Gen. Pleasant, obese, in no distress ENT - no lesions, no post nasal drip Neck: No JVD, no thyromegaly, no carotid bruits Lungs: no use of accessory muscles, no dullness to percussion, decreased without rales or rhonchi  Cardiovascular: Rhythm regular, heart sounds  normal, no murmurs or gallops, no peripheral edema Musculoskeletal: No deformities, no cyanosis or clubbing , no tremors        Assessment & Plan:

## 2015-12-16 NOTE — Assessment & Plan Note (Signed)
Samples of symbicort  Rx for duonebs (generic) twice daily #60 x 3 refills - INSTEAD of albuterol Prednisone 10 mg  -Take 4 tabs  daily with food x 4 days, then 3 tabs daily x 4 days, then 2 tabs daily x 4 days, then 1 tab daily x4 days then stop. #40  Pulm rehab - not interested

## 2015-12-16 NOTE — Patient Instructions (Addendum)
Samples of symbicort  Rx for duonebs (generic) twice daily #60 x 3 refills - INSTEAD of albuterol Prednisone 10 mg  -Take 4 tabs  daily with food x 4 days, then 3 tabs daily x 4 days, then 2 tabs daily x 4 days, then 1 tab daily x4 days then stop. #40  Home sleep study

## 2015-12-17 NOTE — Assessment & Plan Note (Signed)
Given excessive daytime somnolence, narrow pharyngeal exam, witnessed apneas & loud snoring, obstructive sleep apnea is very likely & an overnight polysomnogram will be scheduled as a home study. The pathophysiology of obstructive sleep apnea , it's cardiovascular consequences & modes of treatment including CPAP were discused with the patient in detail & they evidenced understanding.  

## 2016-01-06 ENCOUNTER — Ambulatory Visit: Payer: No Typology Code available for payment source | Admitting: Pulmonary Disease

## 2016-01-24 ENCOUNTER — Telehealth: Payer: Self-pay | Admitting: Pulmonary Disease

## 2016-01-24 NOTE — Telephone Encounter (Signed)
Dr. Vassie LollAlva you had a Home Sleep Study order put in for this patient on 12/16/2015. I have called the # [phone] 3 different times and the person that answered stated not the correct number for this patient. I have also tried calling the brother's # [phone] and LVM for him to have the patient to contact this office to schedule HST. No luck getting in touch with this patient to schedule the HST and she keeps moving her follow up appts out.

## 2016-03-16 ENCOUNTER — Encounter: Payer: Self-pay | Admitting: Adult Health

## 2016-03-16 ENCOUNTER — Ambulatory Visit (INDEPENDENT_AMBULATORY_CARE_PROVIDER_SITE_OTHER): Payer: No Typology Code available for payment source | Admitting: Adult Health

## 2016-03-16 VITALS — BP 115/88 | HR 82 | Temp 97.9°F | Ht 64.0 in | Wt 244.0 lb

## 2016-03-16 DIAGNOSIS — R938 Abnormal findings on diagnostic imaging of other specified body structures: Secondary | ICD-10-CM

## 2016-03-16 DIAGNOSIS — J449 Chronic obstructive pulmonary disease, unspecified: Secondary | ICD-10-CM

## 2016-03-16 DIAGNOSIS — R9389 Abnormal findings on diagnostic imaging of other specified body structures: Secondary | ICD-10-CM

## 2016-03-16 NOTE — Assessment & Plan Note (Signed)
Continue on Symbicort 160/4.5mcg 2 puffs Twice daily  .  Mucinex DM Twice daily  As needed  Cough/congestion. Set up CT chest w/o contrast in October 2017 (prior to Dr. Alva  Appointment )  Follow up Dr. Alva  In 4   months and As needed   Please contact office for sooner follow up if symptoms do not improve or worsen or seek emergency care   

## 2016-03-16 NOTE — Patient Instructions (Addendum)
Continue on Symbicort 160/4.715mcg 2 puffs Twice daily  .  Mucinex DM Twice daily  As needed  Cough/congestion. Set up CT chest w/o contrast in October 2017 (prior to Dr. Vassie LollAlva  Appointment )  Follow up Dr. Vassie LollAlva  In 4   months and As needed   Please contact office for sooner follow up if symptoms do not improve or worsen or seek emergency care

## 2016-03-16 NOTE — Assessment & Plan Note (Signed)
Set up CT chest w/o contrast in October 2017 (prior to Dr. Vassie LollAlva  Appointment )  Follow up Dr. Vassie LollAlva  In 4   months and As needed   Please contact office for sooner follow up if symptoms do not improve or worsen or seek emergency care

## 2016-03-16 NOTE — Progress Notes (Signed)
Subjective:    Patient ID: Sheryl Hicks, female    DOB: 06-Oct-1961, 54 y.o.   MRN: [id number]  HPI 55 yo female former smoker 33  with GOLD C COPD (FEV1 35%, ratio 62) Previous patient of Dr Delford Field  .  Hx polysubstance abuse with crack cocaine.    TEST  CT chest 07/2015 >Severe right upper lobe volume loss with associated calcified granulomas, bronchiectasis, parenchymal banding, distortion and subpleural consolidation in reticulation. Similar but less prominent findings in the right middle lobe with evidence of air trapping in the right middle lobe. 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 2. Nonspecific ground-glass centrilobular nodularity throughout both lungs.  03/16/2016 Follow up : COPD GOLD C  Pt returns for  3 month follow up.  Says overall her breathing is doing okay , no flare of cough or wheezing  Does get winded with walking, uses walker with seat.  She is going on [place] soon.  She remains on Symbicort. Requests samples today .  PVX is utd.    CT chest 07/2015  with severe RUL volume loss w/ associated granulomas /bronchiectasis and nonspecific GG nodularity bilaterally. Discussed repeat CT in OCT this year.  Denies chest pain, orthopnea, edema or fever.     Past Medical History  Diagnosis Date  . Anxiety   . COPD (chronic obstructive pulmonary disease) (HCC)   . Tendonitis of shoulder   . GERD (gastroesophageal reflux disease)   . Asthma   . History of stroke    Current Outpatient Prescriptions on File Prior to Visit  Medication Sig Dispense Refill  . albuterol (PROVENTIL HFA;VENTOLIN HFA) 108 (90 BASE) MCG/ACT inhaler Inhale 2 puffs into the lungs every 6 (six) hours as needed. 18 g 4  . albuterol (PROVENTIL) (2.5 MG/3ML) 0.083% nebulizer solution Take 3 mLs (2.5 mg total) by nebulization every 4 (four) hours as needed for wheezing. 75 mL 6  . ALPRAZolam (XANAX) 1 MG tablet Take 1 mg by mouth 4 (four)  times daily.     . Benzonatate (TESSALON PERLES PO) Take 1 capsule by mouth 3 (three) times daily. Pt unsure of strength    . budesonide-formoterol (SYMBICORT) 160-4.5 MCG/ACT inhaler Inhale 2 puffs into the lungs 2 (two) times daily. 1 Inhaler 5  . Cyclobenzaprine HCl (FLEXERIL PO) Take by mouth every 4 (four) hours as needed.    . ferrous sulfate 325 (65 FE) MG tablet Take 325 mg by mouth 3 (three) times daily.     Marland Kitchen ipratropium-albuterol (DUONEB) 0.5-2.5 (3) MG/3ML SOLN Take 3 mLs by nebulization 2 (two) times daily. 180 mL 3  . omeprazole (PRILOSEC) 20 MG capsule Take 1 capsule (20 mg total) by mouth daily. 30 capsule 6  . oxyCODONE-acetaminophen (PERCOCET/ROXICET) 5-325 MG per tablet Take 1 tablet by mouth every 6 (six) hours.     . sertraline (ZOLOFT) 100 MG tablet Take 100 mg by mouth daily.     No current facility-administered medications on file prior to visit.    Review of Systems .Constitutional:   No  weight loss, night sweats,  Fevers, chills,  +fatigue, or  lassitude.  HEENT:   No headaches,  Difficulty swallowing,  Tooth/dental problems, or  Sore throat,                No sneezing, itching, ear ache, nasal congestion, post nasal drip,   CV:  No chest pain,  Orthopnea, PND, swelling in lower extremities, anasarca, dizziness, palpitations, syncope.  GI  No heartburn, indigestion, abdominal pain, nausea, vomiting, diarrhea, change in bowel habits, loss of appetite, bloody stools.   Resp:    No chest wall deformity  Skin: no rash or lesions.  GU: no dysuria, change in color of urine, no urgency or frequency.  No flank pain, no hematuria   MS:  No joint pain or swelling.  No decreased range of motion.  No back pain.  Psych:  No change in mood or affect. No depression or anxiety.  No memory loss.         Objective:   Physical Exam Filed Vitals:   03/16/16 1020  BP: 115/88  Pulse: 82  Temp: 97.9 F (36.6 C)  TempSrc: Oral  Height: 5\' 4"  (1.626 m)  Weight: 244  lb (110.678 kg)  SpO2: 96%   Body mass index is 41.86 kg/(m^2).   GEN: A/Ox3; pleasant , NAD, obese , walking with walker .  HEENT:  [place]/AT,  EACs-clear, TMs-wnl, NOSE-clear drainage , THROAT-clear, no lesions, no postnasal drip or exudate noted.   NECK:  Supple w/ fair ROM; no JVD; normal carotid impulses w/o bruits; no thyromegaly or nodules palpated; no lymphadenopathy.  RESP  Decreased BS in bases  , w/o, wheezes/ rales/ or rhonchi.no accessory muscle use, no dullness to percussion  CARD:  RRR, no m/r/g  , no peripheral edema, pulses intact, no cyanosis or clubbing.  GI:   Soft & nt; nml bowel sounds; no organomegaly or masses detected.  Musco: Warm bil, no deformities or joint swelling noted.   Neuro: alert, no focal deficits noted.    Skin: Warm, no lesions or rashes   Tammy Parrett NP-C  [facility]  03/16/2016

## 2016-03-17 NOTE — Progress Notes (Signed)
Reviewed & agree with plan  

## 2016-03-21 ENCOUNTER — Telehealth: Payer: Self-pay | Admitting: Adult Health

## 2016-03-21 NOTE — Telephone Encounter (Signed)
Pt will pick up samples on Thursday at 10 to pick it up  [phone]

## 2016-03-21 NOTE — Telephone Encounter (Signed)
Spoke with patient-Symbicort 160/4.5 samples were placed in [place] bag for GalateoMichelle to take with her. Nothing more needed at this time.

## 2016-06-08 ENCOUNTER — Ambulatory Visit (INDEPENDENT_AMBULATORY_CARE_PROVIDER_SITE_OTHER): Payer: No Typology Code available for payment source | Admitting: Pulmonary Disease

## 2016-06-08 ENCOUNTER — Ambulatory Visit (HOSPITAL_BASED_OUTPATIENT_CLINIC_OR_DEPARTMENT_OTHER)
Admission: RE | Admit: 2016-06-08 | Discharge: 2016-06-08 | Disposition: A | Discharge status: Home / Self Care | Payer: No Typology Code available for payment source | Source: Ambulatory Visit | Attending: Pulmonary Disease | Admitting: Pulmonary Disease

## 2016-06-08 ENCOUNTER — Encounter: Payer: Self-pay | Admitting: Pulmonary Disease

## 2016-06-08 VITALS — BP 128/86 | HR 88 | Ht 64.0 in | Wt 235.0 lb

## 2016-06-08 DIAGNOSIS — R938 Abnormal findings on diagnostic imaging of other specified body structures: Secondary | ICD-10-CM

## 2016-06-08 DIAGNOSIS — M129 Arthropathy, unspecified: Secondary | ICD-10-CM | POA: Insufficient documentation

## 2016-06-08 DIAGNOSIS — I700 Atherosclerosis of aorta: Secondary | ICD-10-CM | POA: Insufficient documentation

## 2016-06-08 DIAGNOSIS — R9389 Abnormal findings on diagnostic imaging of other specified body structures: Secondary | ICD-10-CM

## 2016-06-08 DIAGNOSIS — J449 Chronic obstructive pulmonary disease, unspecified: Secondary | ICD-10-CM | POA: Insufficient documentation

## 2016-06-08 DIAGNOSIS — J984 Other disorders of lung: Secondary | ICD-10-CM | POA: Insufficient documentation

## 2016-06-08 MED ORDER — METHYLPREDNISOLONE ACETATE 80 MG/ML IJ SUSP
80.0000 mg | Freq: Once | INTRAMUSCULAR | Status: AC
  Administered 2016-06-08: 80 mg via INTRAMUSCULAR

## 2016-06-08 MED ORDER — DOXYCYCLINE HYCLATE 100 MG PO TABS: 100.0000 mg | ORAL_TABLET | Freq: Every day | ORAL | 0 refills | Status: DC

## 2016-06-08 NOTE — Progress Notes (Signed)
   Subjective:    Patient ID: Sheryl Hicks, female    DOB: 01/03/1961, 55 y.o.   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  Hx polysubstance abuse with crack cocaine.  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 She completed course.  06/08/2016  Chief Complaint  Patient presents with  . Acute Visit    cough, not able to get anything up, chest tightness   For about a week, she had a cold followed by cough and chest tightness-mostly dry and occasional green sputum and wheezing  She has received insurance now-would like refills on all her meds. 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  Nonspecific ground-glass centrilobular nodularity throughout both lungs.   Review of Systems neg for any significant sore throat, dysphagia, itching, sneezing, nasal congestion or excess/ purulent secretions, fever, chills, sweats, unintended wt loss, pleuritic or exertional cp, hempoptysis, orthopnea pnd or change in chronic leg swelling. Also denies presyncope, palpitations, heartburn, abdominal pain, nausea, vomiting, diarrhea or change in bowel or urinary habits, dysuria,hematuria, rash, arthralgias, visual complaints, headache, numbness weakness or ataxia.     Objective:   Physical Exam   Gen. Pleasant, obese, in no distress ENT - no lesions, no post nasal drip Neck: No JVD, no thyromegaly, no carotid bruits Lungs: no use of accessory muscles, no dullness to percussion, decreased without rales , faint scattered rhonchi  Cardiovascular: Rhythm regular, heart sounds  normal, no murmurs or gallops, no peripheral  edema Musculoskeletal: No deformities, no cyanosis or clubbing , no tremors        Assessment & Plan:

## 2016-06-08 NOTE — Patient Instructions (Addendum)
Depo-Medrol 80 Doxy 100 daily x 7 days Refills on Symbicort, albuterol and DuoNeb  CXR today Flu shot once better

## 2016-06-08 NOTE — Assessment & Plan Note (Signed)
Treat as flare Depo-Medrol 80 Doxy 100 daily x 7 days Refills on Symbicort, albuterol and DuoNeb  CXR today Flu shot once better

## 2016-06-08 NOTE — Assessment & Plan Note (Signed)
Chest x-ray today-does not need scan

## 2016-06-10 ENCOUNTER — Telehealth: Payer: Self-pay | Admitting: Internal Medicine

## 2016-06-10 DIAGNOSIS — J449 Chronic obstructive pulmonary disease, unspecified: Secondary | ICD-10-CM

## 2016-06-10 MED ORDER — BUDESONIDE-FORMOTEROL FUMARATE 160-4.5 MCG/ACT IN AERO: 2.0000 | INHALATION_SPRAY | Freq: Two times a day (BID) | RESPIRATORY_TRACT | 5 refills | Status: DC

## 2016-06-10 MED ORDER — ALBUTEROL SULFATE (2.5 MG/3ML) 0.083% IN NEBU: 2.5000 mg | INHALATION_SOLUTION | RESPIRATORY_TRACT | 6 refills | Status: DC | PRN

## 2016-06-10 MED ORDER — ALBUTEROL SULFATE HFA 108 (90 BASE) MCG/ACT IN AERS: 2.0000 | INHALATION_SPRAY | Freq: Four times a day (QID) | RESPIRATORY_TRACT | 4 refills | Status: DC | PRN

## 2016-06-10 NOTE — Telephone Encounter (Signed)
Pt called requesting refills approved by Dr Vassie LollAlva at last ov 06/08/16 > sent in 6 m worth

## 2016-06-22 ENCOUNTER — Telehealth: Payer: Self-pay | Admitting: Pulmonary Disease

## 2016-06-22 NOTE — Telephone Encounter (Signed)
[employer] form completed for PA for Symbicort (Pt has Medicaid) Awaiting Dr. Reginia NaasAlva's signature.  Will fax PA request once RA has signed form.

## 2016-06-27 NOTE — Telephone Encounter (Signed)
Form has been signed and faxed to [employer].  [facility]. Form has been placed in scan folder to be scanned into patient's chart. Nothing further needed.

## 2016-07-13 ENCOUNTER — Telehealth: Payer: Self-pay | Admitting: Internal Medicine

## 2016-07-13 MED ORDER — ALBUTEROL SULFATE (2.5 MG/3ML) 0.083% IN NEBU: 2.5000 mg | INHALATION_SOLUTION | RESPIRATORY_TRACT | 6 refills | Status: AC | PRN

## 2016-07-13 NOTE — Telephone Encounter (Signed)
Spoke with pt and she is requesting to get an order for a hover-round and a walker since she "only has 35% of her breathing". Pt also requests refill on neb meds. Rx sent.   RA - Please advise as to pt's request. Thanks!

## 2016-07-14 NOTE — Telephone Encounter (Signed)
Called and spoke with pt and she is aware of RA recs.  She will check with her PCP for further recs.

## 2016-07-14 NOTE — Telephone Encounter (Signed)
We did not prescribe this for COPD patient's- since aerobic exercise and walking is good for the lungs We can refer to pulmonary rehabilitation She can discuss with PCP if her other medical issues will qualify

## 2016-07-17 ENCOUNTER — Ambulatory Visit (HOSPITAL_BASED_OUTPATIENT_CLINIC_OR_DEPARTMENT_OTHER)
Admission: RE | Admit: 2016-07-17 | Discharge: 2016-07-17 | Disposition: A | Discharge status: Home / Self Care | Payer: Medicaid Other | Source: Ambulatory Visit | Attending: Adult Health | Admitting: Adult Health

## 2016-07-17 DIAGNOSIS — J449 Chronic obstructive pulmonary disease, unspecified: Secondary | ICD-10-CM | POA: Diagnosis present

## 2016-07-17 DIAGNOSIS — J440 Chronic obstructive pulmonary disease with acute lower respiratory infection: Secondary | ICD-10-CM | POA: Diagnosis not present

## 2016-07-17 DIAGNOSIS — J219 Acute bronchiolitis, unspecified: Secondary | ICD-10-CM | POA: Insufficient documentation

## 2016-07-17 DIAGNOSIS — I700 Atherosclerosis of aorta: Secondary | ICD-10-CM | POA: Insufficient documentation

## 2016-07-19 NOTE — Progress Notes (Signed)
lmtcb x1 

## 2016-07-20 NOTE — Progress Notes (Signed)
lmtcb x2 

## 2016-07-21 ENCOUNTER — Telehealth: Payer: Self-pay | Admitting: Pulmonary Disease

## 2016-07-21 NOTE — Telephone Encounter (Signed)
Notes Recorded by Julio Sicksammy S Parrett, NP on 07/17/2016 at 2:52 PM EDT CT chest is similar to 07/2015. No much change  Will cont to follow  Make sure has ov with Dr. Vassie LollAlva In ~1 month to discuss in deatil.  Please contact office for sooner follow up if symptoms do not improve or worsen or seek emergency care  ------------------------------------------------------------ lmtcb x1 for pt.

## 2016-07-21 NOTE — Progress Notes (Signed)
lmtcb x3 

## 2016-07-24 NOTE — Telephone Encounter (Signed)
Patient notified of TP's recommendations. Scheduled to see Dr. Vassie LollAlva on 08/03/16 at 345pm in [place] office. Patient aware of appointment. Nothing further needed.

## 2016-08-02 ENCOUNTER — Telehealth: Payer: Self-pay | Admitting: Pulmonary Disease

## 2016-08-02 NOTE — Telephone Encounter (Signed)
Pt unable to come in on 10/26, and next available appt is 12/20- pt does not wish to wait this long for appt.  RA please advise if pt can be worked into [employer] [facility] somewhere.  Thanks!

## 2016-08-03 ENCOUNTER — Ambulatory Visit: Payer: No Typology Code available for payment source | Admitting: Pulmonary Disease

## 2016-08-03 NOTE — Telephone Encounter (Signed)
LMTCB

## 2016-08-03 NOTE — Telephone Encounter (Signed)
Please get next available appointment with TP Keep appointment with me on 12/20

## 2016-08-07 NOTE — Telephone Encounter (Signed)
lmtcb X2 for pt.  

## 2016-08-08 NOTE — Telephone Encounter (Signed)
lmtcb X3 for pt. A few openings have come up on RA's schedule

## 2016-08-09 NOTE — Telephone Encounter (Signed)
LMOM TCB x4 Per office protocol, will sign off to await pt's return call

## 2016-10-12 ENCOUNTER — Encounter: Payer: Self-pay | Admitting: Adult Health

## 2016-10-12 ENCOUNTER — Ambulatory Visit (INDEPENDENT_AMBULATORY_CARE_PROVIDER_SITE_OTHER): Payer: Medicaid Other | Admitting: Adult Health

## 2016-10-12 DIAGNOSIS — R938 Abnormal findings on diagnostic imaging of other specified body structures: Secondary | ICD-10-CM

## 2016-10-12 DIAGNOSIS — J441 Chronic obstructive pulmonary disease with (acute) exacerbation: Secondary | ICD-10-CM | POA: Diagnosis not present

## 2016-10-12 DIAGNOSIS — R9389 Abnormal findings on diagnostic imaging of other specified body structures: Secondary | ICD-10-CM

## 2016-10-12 MED ORDER — AMOXICILLIN-POT CLAVULANATE 875-125 MG PO TABS: 1.0000 | ORAL_TABLET | Freq: Two times a day (BID) | ORAL | 0 refills | Status: AC

## 2016-10-12 MED ORDER — TIOTROPIUM BROMIDE MONOHYDRATE 2.5 MCG/ACT IN AERS: 2.0000 | INHALATION_SPRAY | Freq: Every day | RESPIRATORY_TRACT | 0 refills | Status: DC

## 2016-10-12 NOTE — Patient Instructions (Addendum)
Augmentin 875mg  Twice daily  For 1 week, take with food.  Mucinex DM Twice daily  As needed  Cough/congestion  Continue on Symbicort 2 puffs Twice daily  .  Add Spiriva 2 puffs daily .  If better next week , get flu shot.  follow up Dr. Vassie LollAlva  In 3-4 months and As needed   Please contact office for sooner follow up if symptoms do not improve or worsen or seek emergency care

## 2016-10-12 NOTE — Assessment & Plan Note (Signed)
Follow up CT chest w/ stable changes in 07/2016,

## 2016-10-12 NOTE — Assessment & Plan Note (Signed)
Flare with bronchitis  Trial of Spiriva   Plan  Patient Instructions  Augmentin 875mg  Twice daily  For 1 week, take with food.  Mucinex DM Twice daily  As needed  Cough/congestion  Continue on Symbicort 2 puffs Twice daily  .  Add Spiriva 2 puffs daily .  If better next week , get flu shot.  follow up Dr. Vassie LollAlva  In 3-4 months and As needed   Please contact office for sooner follow up if symptoms do not improve or worsen or seek emergency care

## 2016-10-12 NOTE — Progress Notes (Signed)
@Patient  ID: Sheryl Hicks, female    DOB: 23-Sep-1961, 56 y.o.   MRN: [id number]  Chief Complaint  Patient presents with  . 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 Nonspecific ground-glass centrilobular nodularity throughout both lungs.  10/12/2016 Follow up : COPD  Pt returns for a 4 month follow up . Says she was doing okay until 1 week ago when she caught a cold with cough, congestion and drainage. Now coughing more with green mucus, more DOE, and intermittent wheezing. She is not using anything for cough.  No fever or body aches . Has not got flu shot . PVX is utd.   Has chronic changes on CT chest, repeat scan in Oct 2017 showed no significant change in RUL volume loss/scarring and GG nodularity through both lungs.   She denies chest pain, orthopnea, edema or hemoptysis .   Allergies  Allergen Reactions  . Prednisone     hives    Immunization History  Administered Date(s) Administered  . Influenza,inj,Quad PF,36+ Mos 07/23/2014  . Pneumococcal Polysaccharide-23 09/09/2012    Past Medical History:  Diagnosis Date  . Anxiety   . Asthma   . COPD (chronic obstructive pulmonary disease) (HCC)   . GERD (gastroesophageal reflux disease)   . History of stroke   . Tendonitis of shoulder     Tobacco History: History  Smoking Status  . Former Smoker  . Packs/day: 0.50  . Years: 39.00  . Types: Cigarettes  . Quit date: 09/08/2014  Smokeless Tobacco  . Never Used    Comment: Started smoking at age 38.    Counseling given: Yes   Outpatient Encounter Prescriptions as of 10/12/2016    Medication Sig  . albuterol (PROVENTIL HFA;VENTOLIN HFA) 108 (90 Base) MCG/ACT inhaler Inhale 2 puffs into the lungs every 6 (six) hours as needed.  Marland Kitchen albuterol (PROVENTIL) (2.5 MG/3ML) 0.083% nebulizer solution Take 3 mLs (2.5 mg total) by nebulization every 4 (four) hours as needed for wheezing.  Marland Kitchen ALPRAZolam (XANAX) 1 MG tablet Take 1 mg by mouth 4 (four) times daily.   . Benzonatate (TESSALON PERLES PO) Take 1 capsule by mouth 3 (three) times daily. Pt unsure of strength  . budesonide-formoterol (SYMBICORT) 160-4.5 MCG/ACT inhaler Inhale 2 puffs into the lungs 2 (two) times daily.  . Cyclobenzaprine HCl (FLEXERIL PO) Take by mouth every 4 (four) hours as needed.  . ferrous sulfate 325 (65 FE) MG tablet Take 325 mg by mouth 3 (three) times daily.   Marland Kitchen omeprazole (PRILOSEC) 20 MG capsule Take 1 capsule (20 mg total) by mouth daily.  Marland Kitchen oxyCODONE-acetaminophen (PERCOCET/ROXICET) 5-325 MG per tablet Take 1 tablet by mouth every 6 (six) hours.   . sertraline (ZOLOFT) 100 MG tablet Take 100 mg by mouth daily.  Marland Kitchen amoxicillin-clavulanate (AUGMENTIN) 875-125 MG tablet Take 1 tablet by mouth 2 (two) times daily.  . Tiotropium Bromide Monohydrate (SPIRIVA RESPIMAT) 2.5 MCG/ACT AERS Inhale 2 puffs into the lungs daily.  . [DISCONTINUED] doxycycline (VIBRA-TABS) 100 MG tablet Take 1 tablet (100 mg total) by mouth daily. (Patient not taking: Reported on 10/12/2016)   No  facility-administered encounter medications on file as of 10/12/2016.      Review of Systems  Constitutional:   No  weight loss, night sweats,  Fevers, chills, + fatigue, or  lassitude.  HEENT:   No headaches,  Difficulty swallowing,  Tooth/dental problems, or  Sore throat,                No sneezing, itching, ear ache,  +nasal congestion, post nasal drip,   CV:  No chest pain,  Orthopnea, PND, swelling in lower extremities, anasarca, dizziness, palpitations, syncope.   GI  No heartburn, indigestion, abdominal pain, nausea, vomiting,  diarrhea, change in bowel habits, loss of appetite, bloody stools.   Resp:    No chest wall deformity  Skin: no rash or lesions.  GU: no dysuria, change in color of urine, no urgency or frequency.  No flank pain, no hematuria   MS:  No joint pain or swelling.  No decreased range of motion.  No back pain.    Physical Exam  BP (!) 142/76 (BP Location: Left Arm, Cuff Size: Normal)   Pulse (!) 104   Ht 5\' 4"  (1.626 m)   Wt 221 lb (100.2 kg)   SpO2 97%   BMI 37.93 kg/m   GEN: A/Ox3; pleasant , NAD, obese , uses walker    HEENT:  [place]/AT,  EACs-clear, TMs-wnl, NOSE-clear drainage, THROAT-clear, no lesions, no postnasal drip or exudate noted.   NECK:  Supple w/ fair ROM; no JVD; normal carotid impulses w/o bruits; no thyromegaly or nodules palpated; no lymphadenopathy.    RESP  Few trace rhonchi w/ no wheezing  no accessory muscle use, no dullness to percussion  CARD:  RRR, no m/r/g, no peripheral edema, pulses intact, no cyanosis or clubbing.  GI:   Soft & nt; nml bowel sounds; no organomegaly or masses detected.   Musco: Warm bil, no deformities or joint swelling noted.   Neuro: alert, no focal deficits noted.    Skin: Warm, no lesions or rashes  Psych:  No change in mood or affect. No depression or anxiety.  No memory loss.  Lab Results:  CBC No results found for: WBC, RBC, HGB, HCT, PLT, MCV, MCH, MCHC, RDW, LYMPHSABS, MONOABS, EOSABS, BASOSABS  BMET No results found for: NA, K, CL, CO2, GLUCOSE, BUN, CREATININE, CALCIUM, GFRNONAA, GFRAA  BNP No results found for: BNP  ProBNP No results found for: PROBNP  Imaging: No results found.   Assessment & Plan:   COPD (chronic obstructive pulmonary disease) Flare with bronchitis  Trial of Spiriva   Plan  Patient Instructions  Augmentin 875mg  Twice daily  For 1 week, take with food.  Mucinex DM Twice daily  As needed  Cough/congestion  Continue on Symbicort 2 puffs Twice daily  .  Add Spiriva 2 puffs daily .    If better next week , get flu shot.  follow up Dr. Vassie LollAlva  In 3-4 months and As needed   Please contact office for sooner follow up if symptoms do not improve or worsen or seek emergency care       Abnormal CT scan, chest Follow up CT chest w/ stable changes in 07/2016,       Rubye Oaksammy Sunshine Mackowski, NP 10/12/2016

## 2016-10-14 NOTE — Progress Notes (Signed)
Reviewed & agree with plan  

## 2016-11-14 ENCOUNTER — Telehealth: Payer: Self-pay | Admitting: Pulmonary Disease

## 2016-11-14 MED ORDER — ALBUTEROL SULFATE HFA 108 (90 BASE) MCG/ACT IN AERS: 2.0000 | INHALATION_SPRAY | Freq: Four times a day (QID) | RESPIRATORY_TRACT | 5 refills | Status: AC | PRN

## 2016-11-14 NOTE — Telephone Encounter (Signed)
lmtcb x1 for pt. 

## 2016-11-14 NOTE — Telephone Encounter (Signed)
Patient returned call [phone].

## 2016-11-14 NOTE — Telephone Encounter (Signed)
Spoke with pt. She is needing a refill on Albuterol HFA. This has been taken care of. Nothing further was needed.

## 2017-01-10 ENCOUNTER — Ambulatory Visit: Payer: No Typology Code available for payment source | Admitting: Pulmonary Disease

## 2017-04-25 ENCOUNTER — Telehealth: Payer: Self-pay | Admitting: Pulmonary Disease

## 2017-04-25 NOTE — Telephone Encounter (Signed)
Pt called who states she is having SOB (oxygen not helping) and white mucus production that has been ongoing for a couple of weeks. She is using thera flu and alka-seltzer to help herself. She requested an appointment for TP on 7/19 in [place]; TP is unavailable. She was informed she could come in on 7/19 for a sick visit; she declined due to transportation and [facility]. Appointment was scheduled for 915 on 05/03/17 with TP in [place]. She stated she would seek emergency if needed. Nothing further is needed.   Routing to RA to make him aware.

## 2017-04-26 NOTE — Telephone Encounter (Signed)
lmomtcb x 1 for the pt.  meds have not been sent to the pharmacy.

## 2017-04-26 NOTE — Telephone Encounter (Signed)
Zpak Mucinex 600 twice a day

## 2017-05-03 ENCOUNTER — Ambulatory Visit (INDEPENDENT_AMBULATORY_CARE_PROVIDER_SITE_OTHER): Payer: Medicaid Other | Admitting: Adult Health

## 2017-05-03 ENCOUNTER — Encounter: Payer: Self-pay | Admitting: Adult Health

## 2017-05-03 VITALS — BP 108/78 | HR 90 | Ht 64.0 in | Wt 209.0 lb

## 2017-05-03 DIAGNOSIS — R9389 Abnormal findings on diagnostic imaging of other specified body structures: Secondary | ICD-10-CM

## 2017-05-03 DIAGNOSIS — R938 Abnormal findings on diagnostic imaging of other specified body structures: Secondary | ICD-10-CM | POA: Diagnosis not present

## 2017-05-03 DIAGNOSIS — R918 Other nonspecific abnormal finding of lung field: Secondary | ICD-10-CM | POA: Diagnosis not present

## 2017-05-03 DIAGNOSIS — J441 Chronic obstructive pulmonary disease with (acute) exacerbation: Secondary | ICD-10-CM

## 2017-05-03 MED ORDER — AMOXICILLIN-POT CLAVULANATE 875-125 MG PO TABS: 1.0000 | ORAL_TABLET | Freq: Two times a day (BID) | ORAL | 0 refills | Status: AC

## 2017-05-03 NOTE — Patient Instructions (Addendum)
Augmentin 875mg  Twice daily  For 1 week, take with food.  Mucinex DM Twice daily  As needed  Cough/congestion  Continue on Symbicort 2 puffs Twice daily  .  Continue on Spiriva 2 puffs daily .  Follow up CT chest in October.  Follow up Dr. Vassie LollAlva  In 3 months and As needed   Please contact office for sooner follow up if symptoms do not improve or worsen or seek emergency care

## 2017-05-03 NOTE — Assessment & Plan Note (Signed)
Exacerbation   Plan  Patient Instructions  Augmentin 875mg  Twice daily  For 1 week, take with food.  Mucinex DM Twice daily  As needed  Cough/congestion  Continue on Symbicort 2 puffs Twice daily  .  Continue on Spiriva 2 puffs daily .  Follow up CT chest in October.  Follow up Dr. Vassie LollAlva  In 3 months and As needed   Please contact office for sooner follow up if symptoms do not improve or worsen or seek emergency care

## 2017-05-03 NOTE — Progress Notes (Signed)
 @Patient  ID: Sheryl Hicks, female    DOB: 19-May-1961, 56 y.o.   MRN: [id number]  Chief Complaint  Patient presents with  . 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 Nonspecific ground-glass centrilobular nodularity throughout both Lungs. CT chest October 2017 showed chronic areas of postinfectious or inflammatory scarring with extensive volume loss in the right upper lobe., Scattered micro-nodularity.   05/03/2017 Follow up : COPD  Patient returns for six-month follow-up. Complains over the last week she's been having increased cough, congestion and drainage. She's getting up some yellow mucus. She denies any hemoptysis, chest pain, orthopnea, PND, or increased leg swelling.  She remains on Symbicort and Spiriva.  Patient has a known abnormal CT scan with scarring along the right upper lobe and micro-nodularity dating back to 2016. She is due for a follow-up CT chest in October of this year.   Allergies  Allergen Reactions  . Prednisone     hives    Immunization History  Administered Date(s) Administered  . Influenza,inj,Quad PF,36+ Mos 07/23/2014  . Pneumococcal Polysaccharide-23 09/09/2012    Past Medical History:  Diagnosis Date  . Anxiety   . Asthma   . COPD (chronic obstructive pulmonary disease) (HCC)   . GERD (gastroesophageal reflux disease)   . History of stroke   . Tendonitis of shoulder     Tobacco History: History  Smoking Status  . Former Smoker  . Packs/day: 0.50  . Years: 39.00  . Types: Cigarettes  . Quit date: 09/08/2014  Smokeless Tobacco  . Never Used   Comment: Started smoking at age 56.    Counseling given: Not Answered   Outpatient Encounter Prescriptions as of 05/03/2017  Medication Sig  . albuterol (PROVENTIL HFA;VENTOLIN HFA) 108 (90 Base) MCG/ACT inhaler Inhale 2 puffs into the lungs every 6 (six) hours as needed.  Marland Kitchen. albuterol (PROVENTIL) (2.5 MG/3ML) 0.083% nebulizer solution Take 3 mLs (2.5 mg total) by nebulization every 4 (four) hours as needed for wheezing.  Marland Kitchen. ALPRAZolam (XANAX) 1 MG tablet Take 1 mg by mouth 4 (four) times daily.   . Benzonatate (TESSALON PERLES PO) Take 1 capsule by mouth 3 (three) times daily. Pt unsure of strength  . budesonide-formoterol (SYMBICORT) 160-4.5 MCG/ACT inhaler Inhale 2 puffs into the lungs 2 (two) times daily.  . Cyclobenzaprine HCl (FLEXERIL PO) Take by mouth every 4 (four) hours as needed.  . ferrous sulfate 325 (65 FE) MG tablet Take 325 mg by mouth 3 (three) times daily.   Marland Kitchen. omeprazole (PRILOSEC) 20 MG capsule Take 1 capsule (20 mg total) by mouth daily.  Marland Kitchen. oxyCODONE-acetaminophen (PERCOCET/ROXICET) 5-325 MG per tablet Take 1 tablet by mouth every 6 (six) hours.   . sertraline (ZOLOFT) 100 MG tablet Take 100 mg by mouth daily.  . Tiotropium Bromide Monohydrate (SPIRIVA RESPIMAT) 2.5 MCG/ACT AERS Inhale 2 puffs into the lungs daily.  Marland Kitchen. amoxicillin-clavulanate (AUGMENTIN) 875-125 MG tablet Take 1 tablet by mouth 2 (two) times daily.   No facility-administered encounter medications on file as of 05/03/2017.      Review of Systems  Constitutional:   No  weight loss, night  sweats,  Fevers, chills, fatigue, or  lassitude.  HEENT:   No headaches,  Difficulty swallowing,  Tooth/dental problems, or  Sore throat,                No sneezing, itching, ear ache, nasal congestion, post nasal drip,   CV:  No chest pain,  Orthopnea, PND, swelling in lower extremities, anasarca, dizziness, palpitations, syncope.   GI  No heartburn, indigestion, abdominal pain, nausea, vomiting, diarrhea, change in  bowel habits, loss of appetite, bloody stools.   Resp: No shortness of breath with exertion or at rest.  No excess mucus, no productive cough,  No non-productive cough,  No coughing up of blood.  No change in color of mucus.  No wheezing.  No chest wall deformity  Skin: no rash or lesions.  GU: no dysuria, change in color of urine, no urgency or frequency.  No flank pain, no hematuria   MS:  No joint pain or swelling.  No decreased range of motion.  No back pain.    Physical Exam  BP 108/78 (BP Location: Left Arm, Patient Position: Sitting, Cuff Size: Normal)   Pulse 90   Ht 5\' 4"  (1.626 m)   Wt 209 lb (94.8 kg)   SpO2 96%   BMI 35.87 kg/m   GEN: A/Ox3; pleasant , NAD, obese    HEENT:  [place]/AT,  EACs-clear, TMs-wnl, NOSE-clear, THROAT-clear, no lesions, no postnasal drip or exudate noted.   NECK:  Supple w/ fair ROM; no JVD; normal carotid impulses w/o bruits; no thyromegaly or nodules palpated; no lymphadenopathy.    RESP  Clear  P & A; w/o, wheezes/ rales/ or rhonchi. no accessory muscle use, no dullness to percussion  CARD:  RRR, no m/r/g, no peripheral edema, pulses intact, no cyanosis or clubbing.  GI:   Soft & nt; nml bowel sounds; no organomegaly or masses detected.   Musco: Warm bil, no deformities or joint swelling noted.   Neuro: alert, no focal deficits noted.    Skin: Warm, no lesions or rashes     Lab Results:  CBC No results found for: WBC, RBC, HGB, HCT, PLT, MCV, MCH, MCHC, RDW, LYMPHSABS, MONOABS, EOSABS, BASOSABS  BMET No results found for: NA, K, CL, CO2, GLUCOSE, BUN, CREATININE, CALCIUM, GFRNONAA, GFRAA  BNP No results found for: BNP  ProBNP No results found for: PROBNP  Imaging: No results found.   Assessment & Plan:   COPD (chronic obstructive pulmonary disease) Exacerbation   Plan  Patient Instructions  Augmentin 875mg  Twice daily  For 1 week, take with food.  Mucinex DM Twice daily  As needed  Cough/congestion  Continue on  Symbicort 2 puffs Twice daily  .  Continue on Spiriva 2 puffs daily .  Follow up CT chest in October.  Follow up Dr. Vassie LollAlva  In 3 months and As needed   Please contact office for sooner follow up if symptoms do not improve or worsen or seek emergency care       Abnormal CT scan, chest Follow up CT chest 07/2017      Horst Ostermiller, NP 05/03/2017

## 2017-05-03 NOTE — Assessment & Plan Note (Signed)
Follow up CT chest 07/2017

## 2017-05-08 NOTE — Telephone Encounter (Signed)
Pt was seen on 05/03/17 by TP. Nothing further is needed.

## 2017-08-06 ENCOUNTER — Telehealth: Payer: Self-pay | Admitting: Pulmonary Disease

## 2017-08-06 NOTE — Telephone Encounter (Signed)
Spoke with Bjorn Loserhonda and notified of recs per RA  She verbalized understanding and nothing further needed

## 2017-08-06 NOTE — Telephone Encounter (Signed)
Spoke with Bjorn LoserRhonda, she states she is needing a pre-cert for the CT without contrast and the insurance wants to know the size of the nodule. The only way she can get the pre-cert is if she knows the size of nodule. TP or Vassie Lolllva would have to call the reading room at [phone] and request the radiologist to measure nodule. Please advise.

## 2017-08-06 NOTE — Telephone Encounter (Signed)
Per TP: 07/2016 CT reports micronodularity.  This typically means <126mm.  Thank you.

## 2017-08-07 ENCOUNTER — Ambulatory Visit (HOSPITAL_BASED_OUTPATIENT_CLINIC_OR_DEPARTMENT_OTHER): Payer: No Typology Code available for payment source

## 2017-08-07 ENCOUNTER — Ambulatory Visit (HOSPITAL_BASED_OUTPATIENT_CLINIC_OR_DEPARTMENT_OTHER): Payer: Medicaid Other

## 2017-08-14 ENCOUNTER — Ambulatory Visit (HOSPITAL_BASED_OUTPATIENT_CLINIC_OR_DEPARTMENT_OTHER): Payer: No Typology Code available for payment source

## 2017-08-14 ENCOUNTER — Other Ambulatory Visit (HOSPITAL_BASED_OUTPATIENT_CLINIC_OR_DEPARTMENT_OTHER): Payer: No Typology Code available for payment source

## 2017-09-04 ENCOUNTER — Ambulatory Visit (HOSPITAL_BASED_OUTPATIENT_CLINIC_OR_DEPARTMENT_OTHER): Payer: Medicaid Other

## 2017-09-11 ENCOUNTER — Telehealth: Payer: Self-pay | Admitting: Pulmonary Disease

## 2017-09-11 ENCOUNTER — Ambulatory Visit (HOSPITAL_BASED_OUTPATIENT_CLINIC_OR_DEPARTMENT_OTHER): Payer: Medicaid Other

## 2017-09-11 NOTE — Telephone Encounter (Signed)
I have called and lmomtcb x 1 for the pt to schedule a follow up with RA.

## 2017-09-11 NOTE — Telephone Encounter (Signed)
Ok Ensure FU

## 2017-09-11 NOTE — Telephone Encounter (Signed)
FYI for RA, insurance will not authorize any more scans due to no shows.

## 2017-09-12 NOTE — Telephone Encounter (Signed)
ok 

## 2017-09-12 NOTE — Telephone Encounter (Signed)
Spoke with Sheryl Hicks, she states the CT was approved and she is having this done tomorrow. I advised her that we would call with results. While on the phone Sheryl Hicks wanted an appt for illness, she states she is coughing and SOB but her mucus is clear. There were no openings today or tomorrow. She wanted to wait until she got her CT done but I wanted to run this by RA.

## 2017-09-13 ENCOUNTER — Ambulatory Visit (HOSPITAL_BASED_OUTPATIENT_CLINIC_OR_DEPARTMENT_OTHER)
Admission: RE | Admit: 2017-09-13 | Discharge: 2017-09-13 | Disposition: A | Discharge status: Home / Self Care | Payer: Medicaid Other | Source: Ambulatory Visit | Attending: Adult Health | Admitting: Adult Health

## 2017-09-13 DIAGNOSIS — R918 Other nonspecific abnormal finding of lung field: Secondary | ICD-10-CM | POA: Diagnosis present

## 2017-09-13 DIAGNOSIS — J479 Bronchiectasis, uncomplicated: Secondary | ICD-10-CM | POA: Diagnosis not present

## 2017-11-05 ENCOUNTER — Ambulatory Visit: Payer: No Typology Code available for payment source | Admitting: Pulmonary Disease

## 2018-01-24 ENCOUNTER — Encounter: Payer: Self-pay | Admitting: Adult Health

## 2018-01-24 ENCOUNTER — Ambulatory Visit (INDEPENDENT_AMBULATORY_CARE_PROVIDER_SITE_OTHER): Payer: Medicaid Other | Admitting: Adult Health

## 2018-01-24 DIAGNOSIS — J449 Chronic obstructive pulmonary disease, unspecified: Secondary | ICD-10-CM | POA: Diagnosis not present

## 2018-01-24 DIAGNOSIS — R9389 Abnormal findings on diagnostic imaging of other specified body structures: Secondary | ICD-10-CM | POA: Diagnosis not present

## 2018-01-24 MED ORDER — TIOTROPIUM BROMIDE MONOHYDRATE 2.5 MCG/ACT IN AERS: 2.0000 | INHALATION_SPRAY | Freq: Every day | RESPIRATORY_TRACT | 5 refills | Status: AC

## 2018-01-24 MED ORDER — BUDESONIDE-FORMOTEROL FUMARATE 160-4.5 MCG/ACT IN AERO: 2.0000 | INHALATION_SPRAY | Freq: Two times a day (BID) | RESPIRATORY_TRACT | 5 refills | Status: DC

## 2018-01-24 NOTE — Assessment & Plan Note (Signed)
Stable   Plan  Patient Instructions  Continue on Symbicort 2 puffs Twice daily  .  Continue on Spiriva 2 puffs daily .  Follow up Dr.Sood or Parrett NP   In 3 months and As needed   Please contact office for sooner follow up if symptoms do not improve or worsen or seek emergency care

## 2018-01-24 NOTE — Patient Instructions (Addendum)
Continue on Symbicort 2 puffs Twice daily  .  Continue on Spiriva 2 puffs daily .  Follow up Dr.Sood or Parrett NP   In 3 months and As needed   Please contact office for sooner follow up if symptoms do not improve or worsen or seek emergency care

## 2018-01-24 NOTE — Progress Notes (Signed)
 @Patient  ID: Sheryl Hicks, female    DOB: 1960-12-18, 57 y.o.   MRN: [id number]  Chief Complaint  Patient presents with  . 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 Nonspecific ground-glass centrilobular nodularity throughout both Lungs. CT chest October 2017 showed chronic areas of postinfectious or inflammatory scarring with extensive volume loss in the right upper lobe., Scattered micro-nodularity. CT chest December 2018 stable volume loss, bronchiectasis in the right upper lobe consistent with a chronic postinfectious scarring.  No change in fine nodular pattern.   01/24/2018 Follow up : COPD /Lung nodules  Patient returns for a follow up for COPD . Marland Kitchen.  Patient says since last visit she has been doing okay.  Has had a COPD exacerbation couple months ago.  She was  treated with antibiotics and prednisone.  Patient says symptoms are improved.  She continues to have some cough on and off.  With clear to white mucus.  Patient had known lung nodules dating back to 2016.  Serial CT chest is showed stable nodules.  Most recent CT chest done and December 2018 showed stable nodules    Allergies  Allergen Reactions  . Prednisone     hives    Immunization History  Administered Date(s) Administered  . Influenza,inj,Quad PF,6+ Mos 07/23/2014, 07/30/2017  . Pneumococcal Polysaccharide-23 09/09/2012    Past Medical History:  Diagnosis Date  . Anxiety   . Asthma   . COPD (chronic obstructive pulmonary disease) (HCC)   . GERD (gastroesophageal reflux disease)   . History of stroke   . Tendonitis of  shoulder     Tobacco History: Social History   Tobacco Use  Smoking Status Former Smoker  . Packs/day: 0.50  . Years: 39.00  . Pack years: 19.50  . Types: Cigarettes  . Last attempt to quit: 09/08/2014  . Years since quitting: 3.3  Smokeless Tobacco Never Used  Tobacco Comment   Started smoking at age 89.    Counseling given: Not Answered Comment: Started smoking at age 57.    Outpatient Encounter Medications as of 01/24/2018  Medication Sig  . albuterol (PROVENTIL HFA;VENTOLIN HFA) 108 (90 Base) MCG/ACT inhaler Inhale 2 puffs into the lungs every 6 (six) hours as needed.  Marland Kitchen. albuterol (PROVENTIL) (2.5 MG/3ML) 0.083% nebulizer solution Take 3 mLs (2.5 mg total) by nebulization every 4 (four) hours as needed for wheezing.  Marland Kitchen. ALPRAZolam (XANAX) 1 MG tablet Take 1 mg by mouth 4 (four) times daily.   . Benzonatate (TESSALON PERLES PO) Take 1 capsule by mouth 3 (three) times daily. Pt unsure of strength  . budesonide-formoterol (SYMBICORT) 160-4.5 MCG/ACT inhaler Inhale 2 puffs into the lungs 2 (two) times daily.  . Cyclobenzaprine HCl (FLEXERIL PO) Take by mouth every 4 (four) hours as needed.  . ferrous sulfate 325 (65 FE) MG tablet Take 325 mg by mouth 3 (three) times daily.   Marland Kitchen. omeprazole (PRILOSEC) 20 MG capsule Take 1 capsule (20 mg total) by mouth daily.  Marland Kitchen. oxyCODONE-acetaminophen (PERCOCET/ROXICET) 5-325 MG per tablet Take 1 tablet by mouth every 6 (six) hours.   . sertraline (ZOLOFT) 100 MG  tablet Take 100 mg by mouth daily.  . Tiotropium Bromide Monohydrate (SPIRIVA RESPIMAT) 2.5 MCG/ACT AERS Inhale 2 puffs into the lungs daily.  . [DISCONTINUED] budesonide-formoterol (SYMBICORT) 160-4.5 MCG/ACT inhaler Inhale 2 puffs into the lungs 2 (two) times daily.  . [DISCONTINUED] Tiotropium Bromide Monohydrate (SPIRIVA RESPIMAT) 2.5 MCG/ACT AERS Inhale 2 puffs into the lungs daily.   No facility-administered encounter medications on file as of 01/24/2018.      Review of  Systems  Constitutional:   No  weight loss, night sweats,  Fevers, chills,  +fatigue, or  lassitude.  HEENT:   No headaches,  Difficulty swallowing,  Tooth/dental problems, or  Sore throat,                No sneezing, itching, ear ache, nasal congestion, post nasal drip,   CV:  No chest pain,  Orthopnea, PND, swelling in lower extremities, anasarca, dizziness, palpitations, syncope.   GI  No heartburn, indigestion, abdominal pain, nausea, vomiting, diarrhea, change in bowel habits, loss of appetite, bloody stools.   Resp:    No excess mucus, no productive cough,  No non-productive cough,  No coughing up of blood.  No change in color of mucus.  No wheezing.  No chest wall deformity  Skin: no rash or lesions.  GU: no dysuria, change in color of urine, no urgency or frequency.  No flank pain, no hematuria   MS:  No joint pain or swelling.  No decreased range of motion.  No back pain.    Physical Exam  BP (!) 144/82 (BP Location: Left Arm, Cuff Size: Normal)   Pulse 97   Ht 5\' 4"  (1.626 m)   Wt 215 lb (97.5 kg)   SpO2 95%   BMI 36.90 kg/m   GEN: A/Ox3; pleasant , NAD, well nourished    HEENT:  [place]/AT,  EACs-clear, TMs-wnl, NOSE-clear, THROAT-clear, no lesions, no postnasal drip or exudate noted.   NECK:  Supple w/ fair ROM; no JVD; normal carotid impulses w/o bruits; no thyromegaly or nodules palpated; no lymphadenopathy.    RESP  Clear  P & A; w/o, wheezes/ rales/ or rhonchi. no accessory muscle use, no dullness to percussion  CARD:  RRR, no m/r/g, no peripheral edema, pulses intact, no cyanosis or clubbing.  GI:   Soft & nt; nml bowel sounds; no organomegaly or masses detected.   Musco: Warm bil, no deformities or joint swelling noted.   Neuro: alert, no focal deficits noted.    Skin: Warm, no lesions or rashes    Lab Results:  CBC No results found for: WBC, RBC, HGB, HCT, PLT, MCV, MCH, MCHC, RDW, LYMPHSABS, MONOABS, EOSABS, BASOSABS  BMET No results found for:  NA, K, CL, CO2, GLUCOSE, BUN, CREATININE, CALCIUM, GFRNONAA, GFRAA  BNP No results found for: BNP  ProBNP No results found for: PROBNP  Imaging: No results found.   Assessment & Plan:   COPD (chronic obstructive pulmonary disease) Stable   Plan  Patient Instructions  Continue on Symbicort 2 puffs Twice daily  .  Continue on Spiriva 2 puffs daily .  Follow up Dr.Sood or Parrett NP   In 3 months and As needed   Please contact office for sooner follow up if symptoms do not improve or worsen or seek emergency care        Abnormal CT scan, chest Stable lung nodules x 2 yr  No further follow up inidcated.      Rubye Oaks, NP 01/24/2018

## 2018-01-24 NOTE — Assessment & Plan Note (Signed)
Stable lung nodules x 2 yr  No further follow up inidcated.

## 2018-01-25 NOTE — Progress Notes (Signed)
Reviewed and agree with assessment/plan.   Noora Locascio, MD [facility] 10/04/2016, 12:24 PM Pager:  [phone]  

## 2018-01-28 ENCOUNTER — Telehealth: Payer: Self-pay | Admitting: Pulmonary Disease

## 2018-01-28 NOTE — Telephone Encounter (Signed)
PA request received by [facility] for Spiriva Respimat 2.5. Called to initiate PA through [employer] at [phone].  PA has been approved for 1 year. PA#: [id number].   Pharmacy has been made aware of approval.  Nothing further needed.

## 2018-05-14 ENCOUNTER — Telehealth: Payer: Self-pay | Admitting: Adult Health
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
## 2018-05-15 NOTE — Telephone Encounter (Signed)
Defer to PCP

## 2018-05-15 NOTE — Telephone Encounter (Signed)
LMTCB

## 2018-05-15 NOTE — Telephone Encounter (Signed)
Attempted to call patient today regarding RA recommendations. I did not receive an answer at time of call. I have left a voicemail message for pt to return call. X1  

## 2018-05-16 NOTE — Telephone Encounter (Signed)
Called and spoke with pt regarding in need of new walker Advised her that RA states she needs to speak with PCP Pt verbalized understanding, and had no questions. Nothing further needed at this time.

## 2018-07-18 IMAGING — CT CT CHEST W/O CM
2 of 3 series · 15 of 36 positions shown, 18 images · non-contrast
Comparison: CT 07/17/2016

CLINICAL DATA: Pulmonary nodules, trouble breathing

EXAM:
CT CHEST WITHOUT CONTRAST
TECHNIQUE: Multidetector CT imaging of the chest was performed following the
standard protocol without IV contrast.

[Series 2: thorax · axial · 0.83mm/px · z∈[-282,-42]mm · 12 of 142 slices shown, 15 images]
[im 11/142  mediastinal]
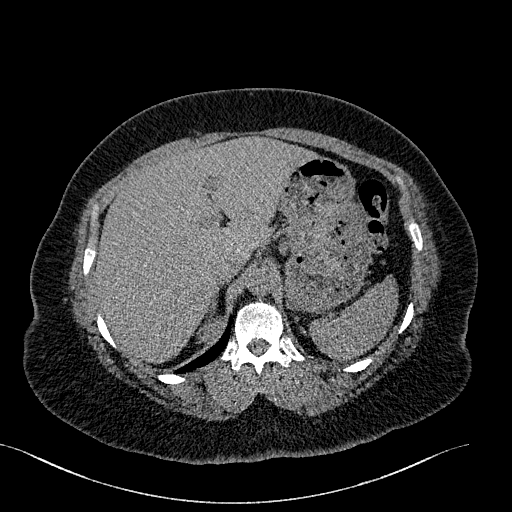
[im 11/142  lung]
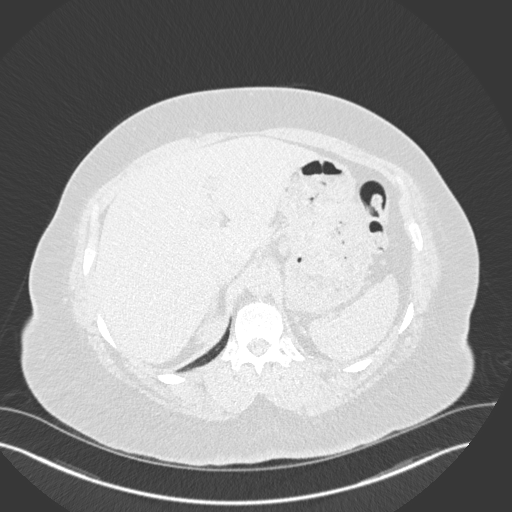
[im 21/142  lung]
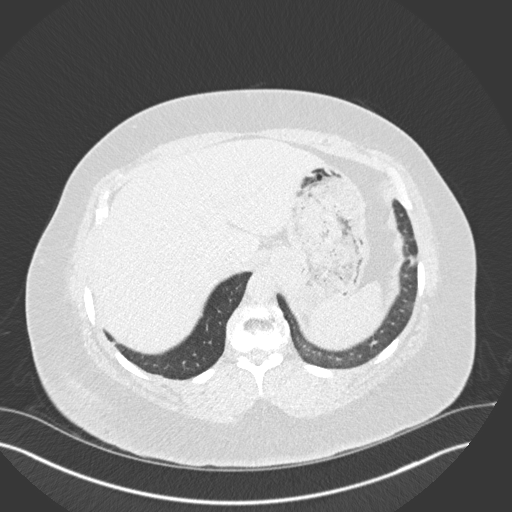
[im 32/142  lung]
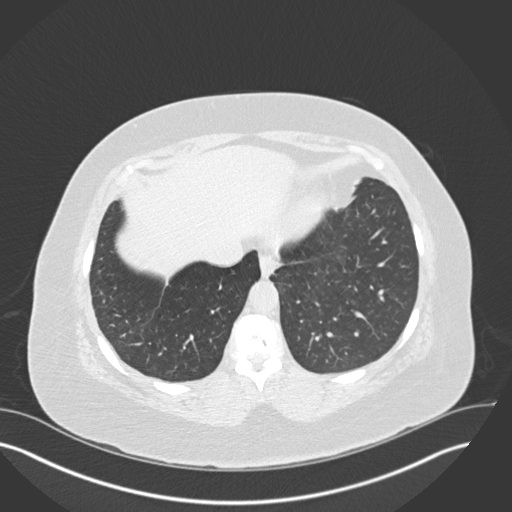
[im 42/142  lung]
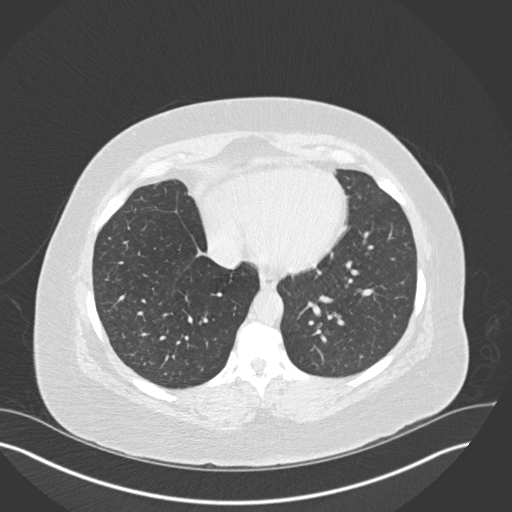
[im 53/142  mediastinal]
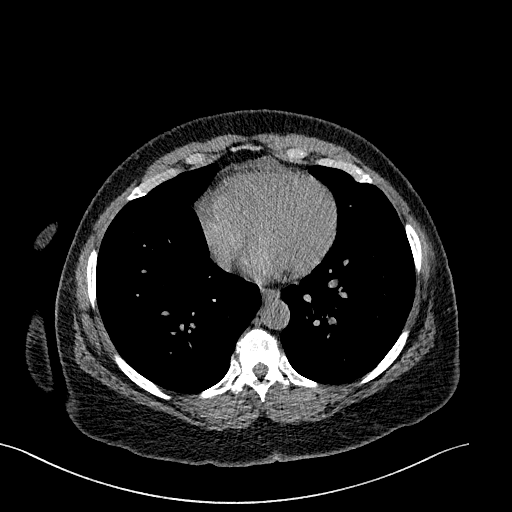
[im 53/142  lung]
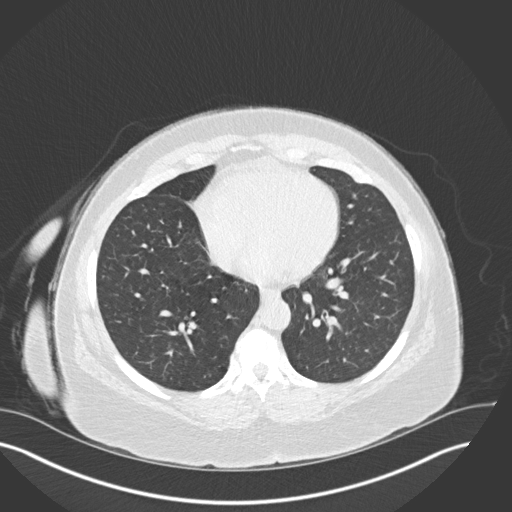
[im 63/142  lung]
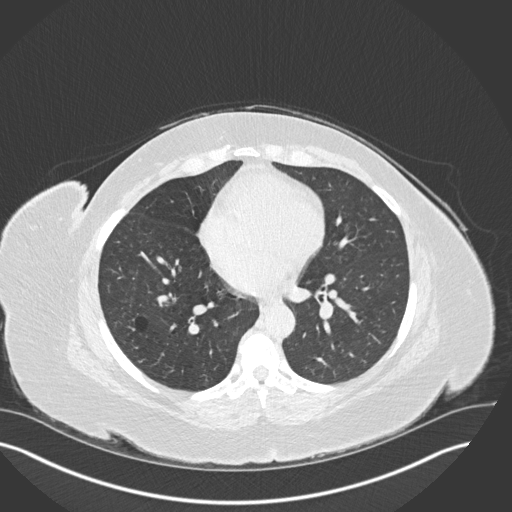
[im 79/142  lung]
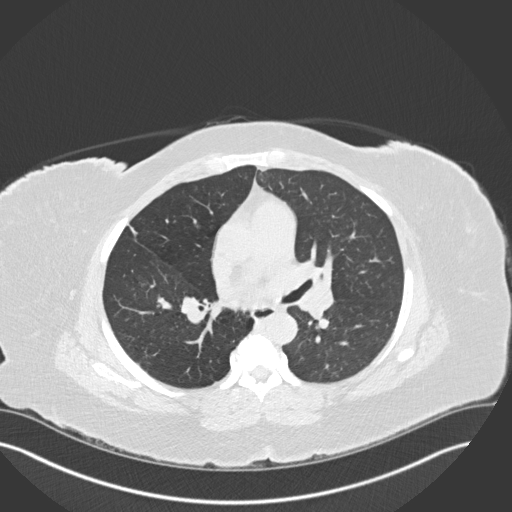
[im 89/142  lung]
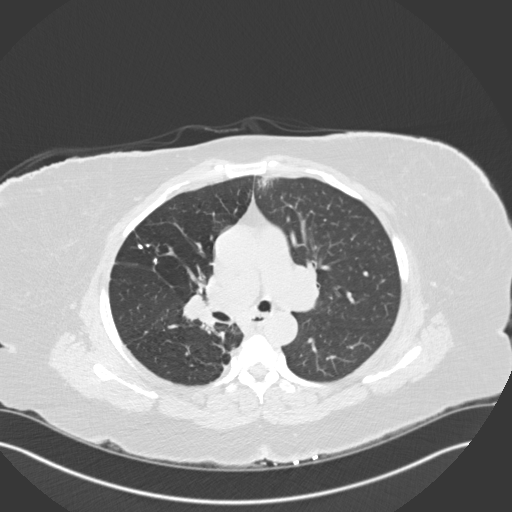
[im 100/142  mediastinal]
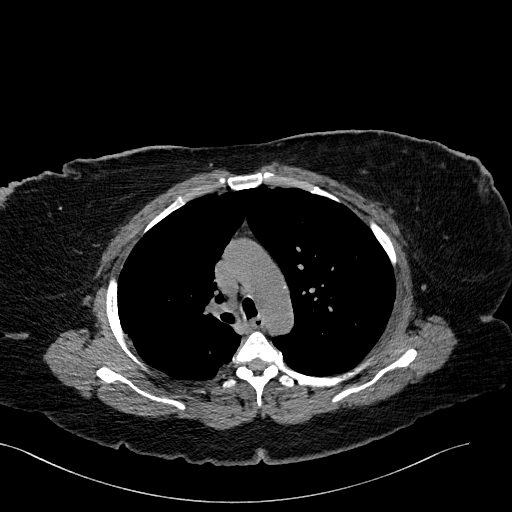
[im 100/142  lung]
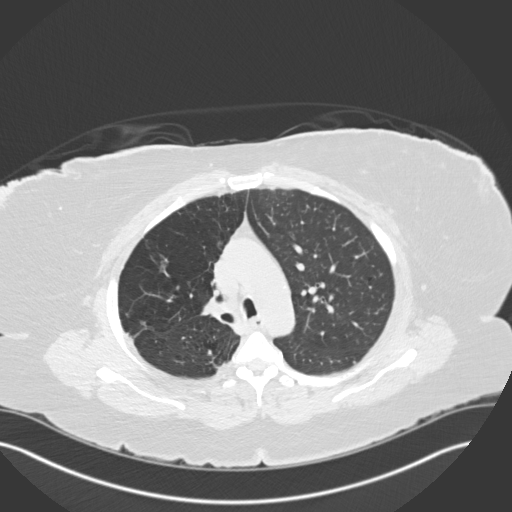
[im 110/142  lung]
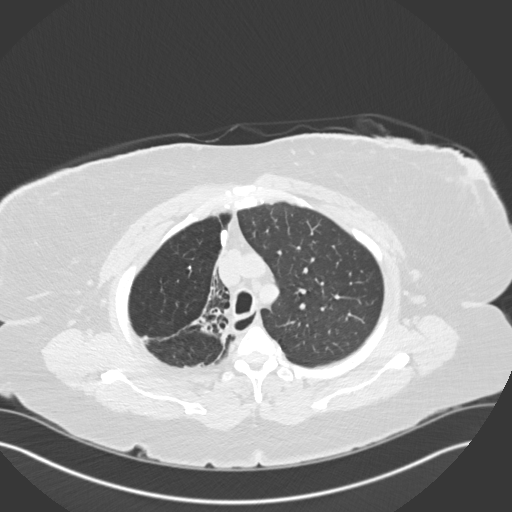
[im 121/142  lung]
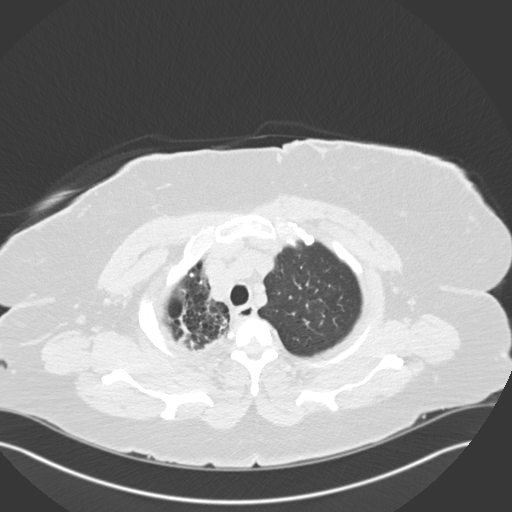
[im 131/142  lung]
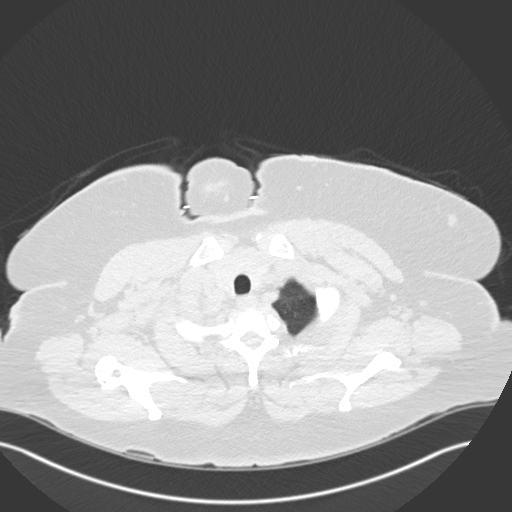

[Series 5: coronal · coronal · 0.59mm/px · 3 of 154 slices shown]
[im 31/154  lung]
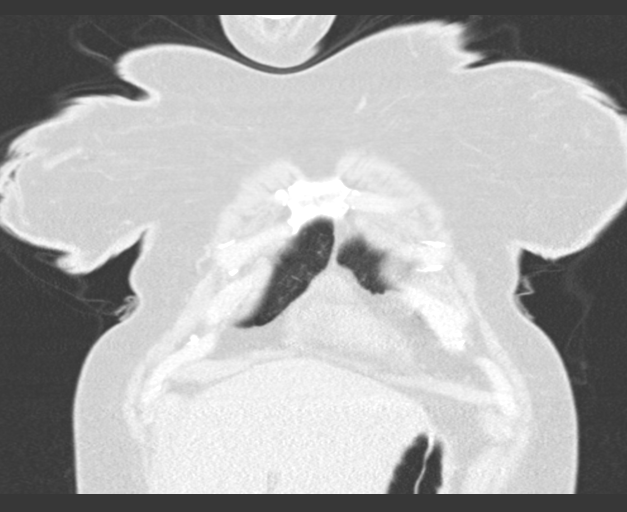
[im 62/154  lung]
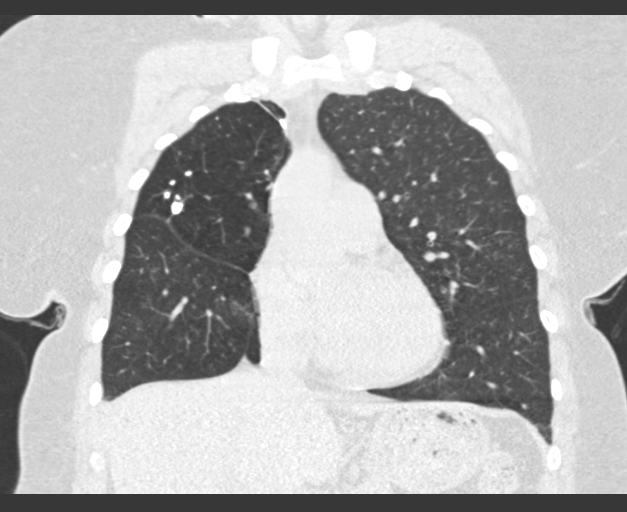
[im 92/154  lung]
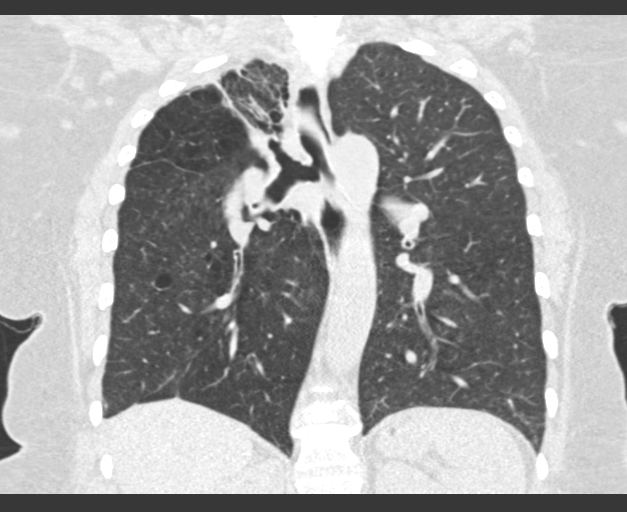

[15 of 36 positions shown; findings below may reference images not displayed]

FINDINGS: Cardiovascular: No significant vascular findings. Normal heart size.
No pericardial effusion.

Mediastinum/Nodes: No axillary supraclavicular adenopathy. No
mediastinal hilar adenopathy.

Lungs/Pleura: Again demonstrated Architectural distortion, linear
scarring and bronchiectasis with volume loss in the RIGHT upper
lobe. Several peripheral calcified nodules.

There is a fine nodular pattern in the lower lobes and LEFT upper
lobe.

Upper Abdomen: Limited view of the liver, kidneys, pancreas are
unremarkable. Normal adrenal glands.

Musculoskeletal: No aggressive osseous lesion.
IMPRESSION: 1. Stable volume loss, bronchiectasis and architectural distortion
in the RIGHT upper lobe most consistent with chronic post infectious
scarring.
2. No change in fine nodular pattern within the LEFT and RIGHT lung
suggesting respiratory bronchiolitis or potential hypersensitivity
pneumonitis.
3. No acute findings

## 2018-08-29 ENCOUNTER — Telehealth: Payer: Self-pay | Admitting: Adult Health

## 2018-08-29 NOTE — Telephone Encounter (Signed)
Called patient unable to reach left message to give us a call back.

## 2018-08-30 NOTE — Telephone Encounter (Signed)
Pt will need an OV and at the OV, things could be discussed in regards to getting her a hovearound.  Attempted to call pt but unable to reach her. Left message for pt to return call.

## 2018-09-02 NOTE — Telephone Encounter (Signed)
Attempted to call pt but no answer. Left message for pt to return call. 

## 2018-09-03 NOTE — Telephone Encounter (Signed)
Attempted to call pt but no answer. Left message for pt to return call. 

## 2018-09-04 NOTE — Telephone Encounter (Signed)
Called patient unable to reach left message to give us a call back.

## 2018-09-06 NOTE — Telephone Encounter (Signed)
Attempted to call pt but unable to reach her. Left message for pt to return call. Due to multiple attempts trying to reach pt with no success, per triage protocol message will be closed. 

## 2018-12-12 ENCOUNTER — Telehealth: Payer: Self-pay | Admitting: Adult Health

## 2018-12-12 NOTE — Telephone Encounter (Signed)
Called patient on both numbers provided. Unable to reach on both numbers. Left message to give [place] a call back on the [phone] number and was unable to leave a voicemail on the other due to continued ringing.

## 2018-12-12 NOTE — Telephone Encounter (Signed)
Patient returning phone call.  Patient phone number is [phone].

## 2018-12-12 NOTE — Telephone Encounter (Signed)
ATC pt, no answer. Left message for pt to call back.  

## 2018-12-13 NOTE — Telephone Encounter (Signed)
ATC patient.  Left message to call back. 

## 2018-12-13 NOTE — Telephone Encounter (Signed)
Pt is calling back [phone] 

## 2018-12-13 NOTE — Telephone Encounter (Signed)
Called patient unable to reach left message to give us a call back.

## 2018-12-16 NOTE — Telephone Encounter (Signed)
Patient calling back [phone]

## 2018-12-16 NOTE — Telephone Encounter (Signed)
Called patient unable to reach left message to give us a call back.

## 2018-12-16 NOTE — Telephone Encounter (Signed)
ATC pt, no answer. Left message for pt to call back.  

## 2018-12-17 ENCOUNTER — Ambulatory Visit (INDEPENDENT_AMBULATORY_CARE_PROVIDER_SITE_OTHER): Payer: Medicaid Other | Admitting: Adult Health

## 2018-12-17 ENCOUNTER — Encounter: Payer: Self-pay | Admitting: Adult Health

## 2018-12-17 ENCOUNTER — Ambulatory Visit (HOSPITAL_BASED_OUTPATIENT_CLINIC_OR_DEPARTMENT_OTHER)
Admission: RE | Admit: 2018-12-17 | Discharge: 2018-12-17 | Disposition: A | Discharge status: Home / Self Care | Payer: Medicaid Other | Source: Ambulatory Visit | Attending: Adult Health | Admitting: Adult Health

## 2018-12-17 ENCOUNTER — Telehealth: Payer: Self-pay | Admitting: Adult Health

## 2018-12-17 VITALS — BP 124/74 | HR 115 | Ht 64.0 in | Wt 235.0 lb

## 2018-12-17 DIAGNOSIS — J9611 Chronic respiratory failure with hypoxia: Secondary | ICD-10-CM | POA: Diagnosis not present

## 2018-12-17 DIAGNOSIS — J449 Chronic obstructive pulmonary disease, unspecified: Secondary | ICD-10-CM

## 2018-12-17 DIAGNOSIS — Z9181 History of falling: Secondary | ICD-10-CM

## 2018-12-17 MED ORDER — DOXYCYCLINE HYCLATE 100 MG PO TABS: 100.0000 mg | ORAL_TABLET | Freq: Two times a day (BID) | ORAL | 0 refills | Status: AC

## 2018-12-17 MED ORDER — METHYLPREDNISOLONE ACETATE 80 MG/ML IJ SUSP
120.0000 mg | Freq: Once | INTRAMUSCULAR | Status: AC
  Administered 2018-12-17: 120 mg via INTRAMUSCULAR

## 2018-12-17 MED ORDER — METHYLPREDNISOLONE ACETATE 80 MG/ML IJ SUSP: 80.0000 mg | Freq: Once | INTRAMUSCULAR | Status: DC

## 2018-12-17 NOTE — Patient Instructions (Addendum)
Doxycycline 100mg  Twice daily  For 1 week, take with food  Depo Medrol Injection today .  Mucinex DM  Twice daily  As needed  Cough/congestion .  Continue on Symbicort 2 puffs Twice daily  .  Continue on Spiriva 2 puffs daily .  Refer to [facility]  Order for POC .  Chest xray today .  Follow up Dr.Sood or Taje Tondreau NP   In 4-6  Weeks  and As needed  -at [employer] .  Please contact office for sooner follow up if symptoms do not improve or worsen or seek emergency care

## 2018-12-17 NOTE — Telephone Encounter (Signed)
Called patient unable to reach left message to give us a call back.

## 2018-12-17 NOTE — Telephone Encounter (Signed)
Called patient, unable to reach. LMTCB. Per protocol will close encounter.

## 2018-12-17 NOTE — Progress Notes (Signed)
@Patient  ID: Sheryl Hicks, female    DOB: Oct 01, 1961, 58 y.o.   MRN: [id number]  Chief Complaint  Patient presents with  . 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 Nonspecific ground-glass centrilobular nodularity throughout both Lungs. CT chest October 2017 showed chronic areas of postinfectious or inflammatory scarring with extensive volume loss in the right upper lobe., Scattered micro-nodularity. CT chest December 2018 stable volume loss, bronchiectasis in the right upper lobe consistent with a chronic postinfectious scarring.  No change in fine nodular pattern.    12/17/2018 Acute OV : Cough  Patient presents for an acute office visit.  She complains over the last 2 weeks that she has had increased cough congestion with thick mucus that is yellow along with intermittent wheezing.  Says the symptoms are not getting any better.  Over-the-counter cold medicines are not working.  Patient also request an letter to her apartment complex requesting first floor availability and also no mold.  Says that she is has a hard time of the elevators not working in her apartment building being able to get to her apartment.  She also has noticed that her apartment has signs of mold on the walls.  Patient uses a rolling walker.  Says it is hard for her to get around at times.  We discussed pulmonary rehab.  Says she has oxygen tanks at home that she uses O2 with activity . Hard to carry she is too weak and gets winded .   Allergies  Allergen Reactions  . Prednisone     hives    Immunization History    Administered Date(s) Administered  . Influenza,inj,Quad PF,6+ Mos 07/23/2014, 07/30/2017  . Pneumococcal Polysaccharide-23 09/09/2012    Past Medical History:  Diagnosis Date  . Anxiety   . Asthma   . COPD (chronic obstructive pulmonary disease) (HCC)   . GERD (gastroesophageal reflux disease)   . History of stroke   . Tendonitis of shoulder     Tobacco History: Social History   Tobacco Use  Smoking Status Former Smoker  . Packs/day: 0.50  . Years: 39.00  . Pack years: 19.50  . Types: Cigarettes  . Last attempt to quit: 09/08/2014  . Years since quitting: 4.2  Smokeless Tobacco Never Used  Tobacco Comment   Started smoking at age 72.    Counseling given: Not Answered Comment: Started smoking at age 67.    Outpatient Medications Prior to Visit  Medication Sig Dispense Refill  . albuterol (PROVENTIL HFA;VENTOLIN HFA) 108 (90 Base) MCG/ACT inhaler Inhale 2 puffs into the lungs every 6 (six) hours as needed. 1 Inhaler 5  . albuterol (PROVENTIL) (2.5 MG/3ML) 0.083% nebulizer solution Take 3 mLs (2.5 mg total) by nebulization every 4 (four) hours as needed for wheezing. 75 mL 6  . ALPRAZolam (XANAX) 1 MG tablet Take 1 mg by mouth 4 (four) times daily.     . Benzonatate (TESSALON PERLES PO) Take 1 capsule by mouth 3 (three) times daily. Pt unsure of strength    . budesonide-formoterol (SYMBICORT) 160-4.5 MCG/ACT inhaler Inhale 2 puffs into the lungs 2 (two) times daily.  1 Inhaler 5  . Cyclobenzaprine HCl (FLEXERIL PO) Take by mouth every 4 (four) hours as needed.    . ferrous sulfate 325 (65 FE) MG tablet Take 325 mg by mouth 3 (three) times daily.     Marland Kitchen omeprazole (PRILOSEC) 20 MG capsule Take 1 capsule (20 mg total) by mouth daily. 30 capsule 6  . oxyCODONE-acetaminophen (PERCOCET/ROXICET) 5-325 MG per tablet Take 1 tablet by mouth every 6 (six) hours.     . sertraline (ZOLOFT) 100 MG tablet Take 100 mg by mouth daily.    . Tiotropium Bromide Monohydrate (SPIRIVA RESPIMAT)  2.5 MCG/ACT AERS Inhale 2 puffs into the lungs daily. 1 Inhaler 5   No facility-administered medications prior to visit.      Review of Systems:   Constitutional:   No  weight loss, night sweats,  Fevers, chills,  +fatigue, or  lassitude.  HEENT:   No headaches,  Difficulty swallowing,  Tooth/dental problems, or  Sore throat,                No sneezing, itching, ear ache,  +nasal congestion, post nasal drip,   CV:  No chest pain,  Orthopnea, PND, swelling in lower extremities, anasarca, dizziness, palpitations, syncope.   GI  No heartburn, indigestion, abdominal pain, nausea, vomiting, diarrhea, change in bowel habits, loss of appetite, bloody stools.   Resp:  No chest wall deformity  Skin: no rash or lesions.  GU: no dysuria, change in color of urine, no urgency or frequency.  No flank pain, no hematuria   MS:  +joint pain    Physical Exam  BP 124/74 (BP Location: Left Arm, Cuff Size: Normal)   Pulse (!) 115   Ht 5\' 4"  (1.626 m)   Wt 235 lb (106.6 kg)   SpO2 96%   BMI 40.34 kg/m   GEN: A/Ox3; pleasant , NAD, obese , rolling walker with seat    HEENT:  [place]/AT,  EACs-clear, TMs-wnl, NOSE-clear drainage , THROAT-clear, no lesions, no postnasal drip or exudate noted.   NECK:  Supple w/ fair ROM; no JVD; normal carotid impulses w/o bruits; no thyromegaly or nodules palpated; no lymphadenopathy.    RESP  Few trace rhonchi  . no accessory muscle use, no dullness to percussion  CARD:  RRR, no m/r/g, no peripheral edema, pulses intact, no cyanosis or clubbing.  GI:   Soft & nt; nml bowel sounds; no organomegaly or masses detected.   Musco: Warm bil, no deformities or joint swelling noted.   Neuro: alert, no focal deficits noted.    Skin: Warm, no lesions or rashes    Lab Results:  CBC No results found for: WBC, RBC, HGB, HCT, PLT, MCV, MCH, MCHC, RDW, LYMPHSABS, MONOABS, EOSABS, BASOSABS  BMET No results found for: NA, K, CL, CO2, GLUCOSE, BUN, CREATININE,  CALCIUM, GFRNONAA, GFRAA  BNP No results found for: BNP  ProBNP No results found for: PROBNP  Imaging: No results found.    No flowsheet data found.  No results found for: NITRICOXIDE      Assessment & Plan:   No problem-specific Assessment & Plan notes found for this encounter.     Rubye Oaks, NP 12/17/2018

## 2018-12-19 DIAGNOSIS — J9611 Chronic respiratory failure with hypoxia: Secondary | ICD-10-CM | POA: Insufficient documentation

## 2018-12-19 NOTE — Telephone Encounter (Signed)
Called and spoke with patient, she stated that she needed to know when and where the POC order was sent to. Made patient aware. Nothing further needed.

## 2018-12-19 NOTE — Assessment & Plan Note (Signed)
Flare  Unable to take oral steroids .   Plan  Patient Instructions  Doxycycline 100mg  Twice daily  For 1 week, take with food  Depo Medrol Injection today .  Mucinex DM  Twice daily  As needed  Cough/congestion .  Continue on Symbicort 2 puffs Twice daily  .  Continue on Spiriva 2 puffs daily .  Refer to [facility]  Order for POC .  Chest xray today .  Follow up Dr.Sood or Parrett NP   In 4-6  Weeks  and As needed  -at [employer] .  Please contact office for sooner follow up if symptoms do not improve or worsen or seek emergency care

## 2018-12-19 NOTE — Assessment & Plan Note (Signed)
Deconditioned  Need pulmonary rehab .  Would benefit from first floor apartment .

## 2018-12-19 NOTE — Telephone Encounter (Signed)
Pt is calling back [phone]

## 2018-12-19 NOTE — Assessment & Plan Note (Signed)
Order for POC . Unable to carry heavy O2 tanks

## 2018-12-20 ENCOUNTER — Telehealth: Payer: Self-pay | Admitting: Adult Health

## 2018-12-20 NOTE — Telephone Encounter (Signed)
Pt is calling back. Pt is requesting VM be left with details of results. Cb is [phone]

## 2018-12-20 NOTE — Telephone Encounter (Signed)
Advised pt of results. Pt understood and nothing further is needed.    Notes recorded by Julio Sicks, NP on 12/20/2018 at 9:48 AM EDT Chest xray shows no sign of PNA  Cont w/ ov recs Please contact office for sooner follow up if symptoms do not improve or worsen or seek emergency care

## 2019-01-28 ENCOUNTER — Ambulatory Visit: Payer: Medicaid Other | Admitting: Adult Health

## 2019-02-11 ENCOUNTER — Other Ambulatory Visit: Payer: Self-pay | Admitting: Adult Health

## 2019-04-08 ENCOUNTER — Other Ambulatory Visit: Payer: Self-pay | Admitting: Adult Health

## 2019-04-30 ENCOUNTER — Telehealth: Payer: Self-pay | Admitting: Adult Health

## 2019-04-30 DIAGNOSIS — J9611 Chronic respiratory failure with hypoxia: Secondary | ICD-10-CM

## 2019-04-30 NOTE — Telephone Encounter (Signed)
Left message for patient to call back  

## 2019-05-01 NOTE — Telephone Encounter (Signed)
ATC pt, no answer. Left message for pt to call back.  

## 2019-05-02 NOTE — Telephone Encounter (Signed)
Spoke with patient. She is aware that I will send the order today. Verbalized understanding. Order has been placed. Nothing further needed at time of call.

## 2019-05-02 NOTE — Telephone Encounter (Signed)
Yes, that's fine 

## 2019-05-02 NOTE — Telephone Encounter (Signed)
Spoke with patient. She stated that she received a call from [employer] stating that they need to have the POC order from March replaced so they can attempt to provide her with a POC. Per patient, the order from March is no longer active on their end.   Patient's AVS:  "Instructions    Return in about 6 weeks (around 01/28/2019). Doxycycline 100mg  Twice daily  For 1 week, take with food  Depo Medrol Injection today .  Mucinex DM  Twice daily  As needed  Cough/congestion .  Continue on Symbicort 2 puffs Twice daily  .  Continue on Spiriva 2 puffs daily .  Refer to [facility]  Order for POC .  Chest xray today .  Follow up Dr.Sood or Parrett NP   In 4-6  Weeks  and As needed  -at [employer] .  Please contact office for sooner follow up if symptoms do not improve or worsen or seek emergency care       Advised patient that TP was not in the office until Tuesday but I would ask one of our NPs if they would be ok with signing the order on her behalf. She verbalized understanding.   Beth, please advise if you are ok with signing the order on behalf of TP. Thanks!

## 2019-05-07 ENCOUNTER — Other Ambulatory Visit: Payer: Self-pay | Admitting: Adult Health

## 2019-06-07 ENCOUNTER — Other Ambulatory Visit: Payer: Self-pay | Admitting: Adult Health

## 2019-06-12 ENCOUNTER — Telehealth: Payer: Self-pay | Admitting: Adult Health

## 2019-06-12 NOTE — Telephone Encounter (Signed)
Spoke with pt and advised her that [place] did not have any POC's at this time. She understood and nothing further is needed.

## 2019-07-07 ENCOUNTER — Other Ambulatory Visit: Payer: Self-pay | Admitting: Adult Health

## 2019-07-11 ENCOUNTER — Telehealth: Payer: Self-pay | Admitting: Pulmonary Disease

## 2019-07-11 NOTE — Telephone Encounter (Signed)
Called and spoke to [facility] she is going to contact Ms Walls they do have some POC's it just depends on what she is wanting there is some confusion on what they offer

## 2019-10-21 IMAGING — DX CHEST - 2 VIEW
2 series · 2 of 2 positions shown · non-contrast
Comparison: None.

CLINICAL DATA: Pt c/o increasing sob over the past two weeks, copd,
gerdcopd

EXAM:
CHEST - 2 VIEW

[chest pa]
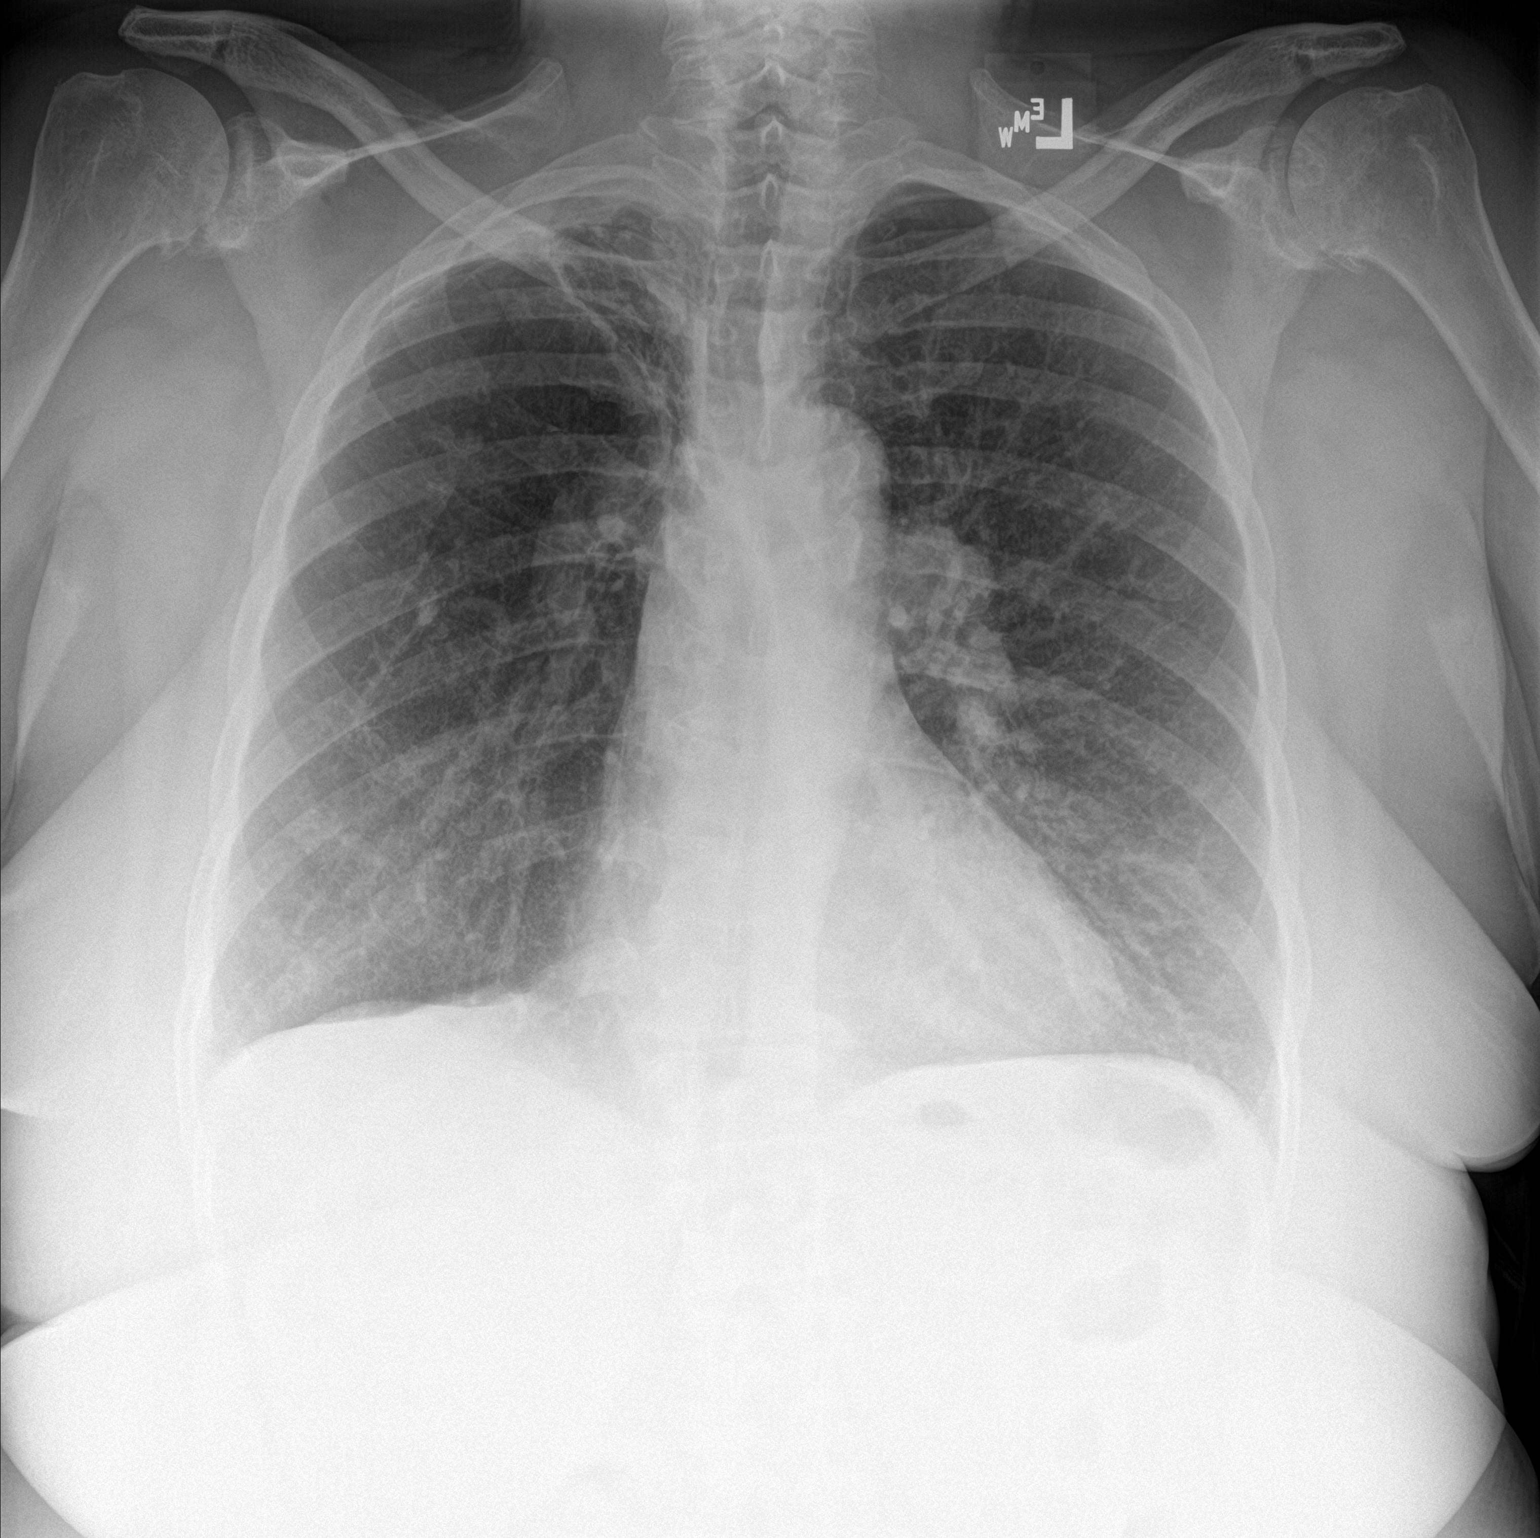

[chest lat]
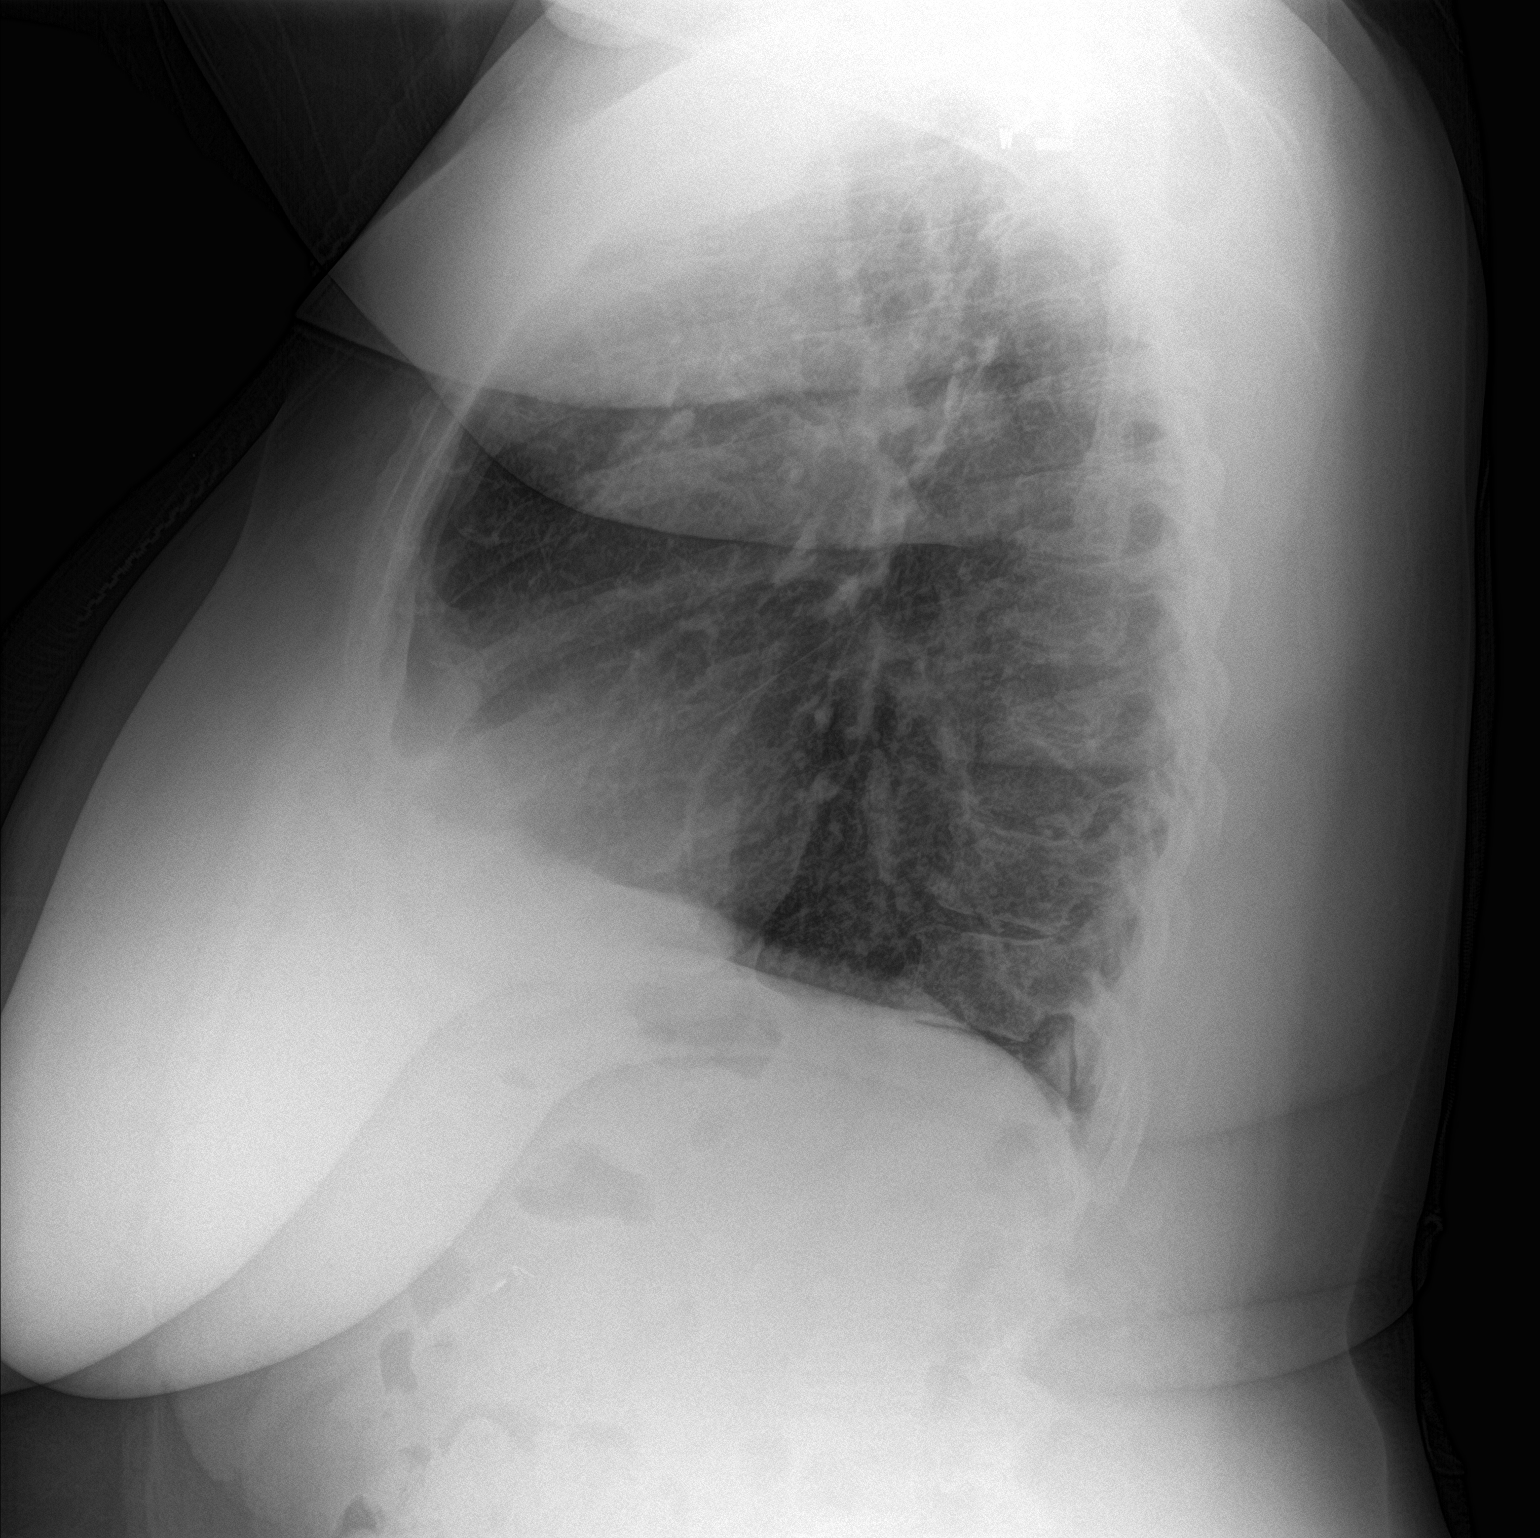

[2 of 2 positions shown; findings below may reference images not displayed]

FINDINGS: Normal cardiac silhouette. Fine bronchitic markings noted. Lungs are
hyperinflated. No effusion, infiltrate pneumothorax. Degenerate
changes of the shoulders.
IMPRESSION: Chronic bronchitic markings and mild hyperinflation. No acute
findings.

## 2022-01-03 ENCOUNTER — Ambulatory Visit: Payer: Medicaid Other | Admitting: Adult Health
